# Patient Record
Sex: Female | Born: 1980 | Race: White | Hispanic: No | Smoking: Current every day smoker
Health system: Southern US, Community
[De-identification: ages and names within clinical notes are randomized; demographics above are authoritative.]

## PROBLEM LIST (undated history)

## (undated) DIAGNOSIS — K5090 Crohn's disease, unspecified, without complications: Secondary | ICD-10-CM

## (undated) DIAGNOSIS — I10 Essential (primary) hypertension: Secondary | ICD-10-CM

## (undated) HISTORY — PX: BREAST ENHANCEMENT SURGERY: SHX7

---

## 2016-05-02 ENCOUNTER — Emergency Department (HOSPITAL_COMMUNITY)
Admission: EM | Admit: 2016-05-02 | Discharge: 2016-05-02 | Discharge status: Against Medical Advice | Payer: Medicaid - Out of State | Source: Home / Self Care | Attending: Emergency Medicine | Admitting: Emergency Medicine

## 2016-05-02 ENCOUNTER — Emergency Department (HOSPITAL_COMMUNITY)
Admission: EM | Admit: 2016-05-02 | Discharge: 2016-05-03 | Disposition: A | Discharge status: Home / Self Care | Payer: Medicaid - Out of State | Attending: Emergency Medicine | Admitting: Emergency Medicine

## 2016-05-02 ENCOUNTER — Emergency Department (HOSPITAL_COMMUNITY): Payer: Medicaid - Out of State

## 2016-05-02 ENCOUNTER — Encounter (HOSPITAL_COMMUNITY): Payer: Self-pay | Admitting: Radiology

## 2016-05-02 ENCOUNTER — Encounter (HOSPITAL_COMMUNITY): Payer: Self-pay | Admitting: *Deleted

## 2016-05-02 DIAGNOSIS — M79669 Pain in unspecified lower leg: Secondary | ICD-10-CM | POA: Diagnosis not present

## 2016-05-02 DIAGNOSIS — Z79899 Other long term (current) drug therapy: Secondary | ICD-10-CM | POA: Insufficient documentation

## 2016-05-02 DIAGNOSIS — R509 Fever, unspecified: Secondary | ICD-10-CM | POA: Diagnosis not present

## 2016-05-02 DIAGNOSIS — D696 Thrombocytopenia, unspecified: Secondary | ICD-10-CM

## 2016-05-02 DIAGNOSIS — K859 Acute pancreatitis without necrosis or infection, unspecified: Secondary | ICD-10-CM | POA: Insufficient documentation

## 2016-05-02 DIAGNOSIS — Z3202 Encounter for pregnancy test, result negative: Secondary | ICD-10-CM

## 2016-05-02 DIAGNOSIS — R748 Abnormal levels of other serum enzymes: Secondary | ICD-10-CM | POA: Diagnosis not present

## 2016-05-02 DIAGNOSIS — D72819 Decreased white blood cell count, unspecified: Secondary | ICD-10-CM | POA: Insufficient documentation

## 2016-05-02 DIAGNOSIS — Z8719 Personal history of other diseases of the digestive system: Secondary | ICD-10-CM

## 2016-05-02 DIAGNOSIS — R112 Nausea with vomiting, unspecified: Secondary | ICD-10-CM

## 2016-05-02 DIAGNOSIS — L409 Psoriasis, unspecified: Secondary | ICD-10-CM | POA: Diagnosis not present

## 2016-05-02 DIAGNOSIS — I10 Essential (primary) hypertension: Secondary | ICD-10-CM | POA: Diagnosis not present

## 2016-05-02 DIAGNOSIS — R1084 Generalized abdominal pain: Secondary | ICD-10-CM | POA: Diagnosis not present

## 2016-05-02 DIAGNOSIS — F17210 Nicotine dependence, cigarettes, uncomplicated: Secondary | ICD-10-CM | POA: Insufficient documentation

## 2016-05-02 DIAGNOSIS — R109 Unspecified abdominal pain: Secondary | ICD-10-CM

## 2016-05-02 DIAGNOSIS — R945 Abnormal results of liver function studies: Secondary | ICD-10-CM | POA: Insufficient documentation

## 2016-05-02 DIAGNOSIS — R140 Abdominal distension (gaseous): Secondary | ICD-10-CM | POA: Insufficient documentation

## 2016-05-02 DIAGNOSIS — R1012 Left upper quadrant pain: Secondary | ICD-10-CM | POA: Diagnosis present

## 2016-05-02 HISTORY — DX: Crohn's disease, unspecified, without complications: K50.90

## 2016-05-02 LAB — DIFFERENTIAL
BASOS PCT: 1 %
Basophils Absolute: 0 10*3/uL (ref 0.0–0.1)
EOS PCT: 1 %
Eosinophils Absolute: 0 10*3/uL (ref 0.0–0.7)
Lymphocytes Relative: 22 %
Lymphs Abs: 0.7 10*3/uL (ref 0.7–4.0)
MONO ABS: 0.3 10*3/uL (ref 0.1–1.0)
MONOS PCT: 10 %
NEUTROS ABS: 2.2 10*3/uL (ref 1.7–7.7)
Neutrophils Relative %: 66 %

## 2016-05-02 LAB — URINALYSIS, ROUTINE W REFLEX MICROSCOPIC
GLUCOSE, UA: NEGATIVE mg/dL
Ketones, ur: 15 mg/dL — AB
Nitrite: NEGATIVE
PROTEIN: 100 mg/dL — AB
Specific Gravity, Urine: 1.025 (ref 1.005–1.030)
pH: 8.5 — ABNORMAL HIGH (ref 5.0–8.0)

## 2016-05-02 LAB — LIPASE, BLOOD: Lipase: 119 U/L — ABNORMAL HIGH (ref 11–51)

## 2016-05-02 LAB — COMPREHENSIVE METABOLIC PANEL
ALK PHOS: 110 U/L (ref 38–126)
ALT: 146 U/L — AB (ref 14–54)
ANION GAP: 17 — AB (ref 5–15)
AST: 585 U/L — ABNORMAL HIGH (ref 15–41)
Albumin: 4.3 g/dL (ref 3.5–5.0)
BUN: 6 mg/dL (ref 6–20)
CALCIUM: 9.7 mg/dL (ref 8.9–10.3)
CO2: 23 mmol/L (ref 22–32)
CREATININE: 0.81 mg/dL (ref 0.44–1.00)
Chloride: 99 mmol/L — ABNORMAL LOW (ref 101–111)
Glucose, Bld: 105 mg/dL — ABNORMAL HIGH (ref 65–99)
Potassium: 3.7 mmol/L (ref 3.5–5.1)
SODIUM: 139 mmol/L (ref 135–145)
TOTAL PROTEIN: 7.6 g/dL (ref 6.5–8.1)
Total Bilirubin: 2.3 mg/dL — ABNORMAL HIGH (ref 0.3–1.2)

## 2016-05-02 LAB — URINE MICROSCOPIC-ADD ON

## 2016-05-02 LAB — CBC
HCT: 41.2 % (ref 36.0–46.0)
HEMOGLOBIN: 14 g/dL (ref 12.0–15.0)
MCH: 33.7 pg (ref 26.0–34.0)
MCHC: 34 g/dL (ref 30.0–36.0)
MCV: 99.3 fL (ref 78.0–100.0)
PLATELETS: 41 10*3/uL — AB (ref 150–400)
RBC: 4.15 MIL/uL (ref 3.87–5.11)
RDW: 14.4 % (ref 11.5–15.5)
WBC: 3.3 10*3/uL — AB (ref 4.0–10.5)

## 2016-05-02 LAB — I-STAT CG4 LACTIC ACID, ED: Lactic Acid, Venous: 0.97 mmol/L (ref 0.5–2.0)

## 2016-05-02 LAB — PREGNANCY, URINE: Preg Test, Ur: NEGATIVE

## 2016-05-02 MED ORDER — SODIUM CHLORIDE 0.9 % IV BOLUS (SEPSIS)
1000.0000 mL | Freq: Once | INTRAVENOUS | Status: AC
  Administered 2016-05-02: 1000 mL via INTRAVENOUS

## 2016-05-02 MED ORDER — ONDANSETRON 4 MG PO TBDP: 4.0000 mg | ORAL_TABLET | Freq: Three times a day (TID) | ORAL | Status: AC | PRN

## 2016-05-02 MED ORDER — ONDANSETRON 4 MG PO TBDP
ORAL_TABLET | ORAL | Status: AC
  Filled 2016-05-02: qty 1

## 2016-05-02 MED ORDER — SODIUM CHLORIDE 0.9 % IV BOLUS (SEPSIS): 1000.0000 mL | Freq: Once | INTRAVENOUS | Status: DC

## 2016-05-02 MED ORDER — ONDANSETRON HCL 4 MG/2ML IJ SOLN: 4.0000 mg | Freq: Once | INTRAMUSCULAR | Status: DC

## 2016-05-02 MED ORDER — HYDROMORPHONE HCL 1 MG/ML IJ SOLN
1.0000 mg | Freq: Once | INTRAMUSCULAR | Status: AC
  Administered 2016-05-02: 1 mg via INTRAVENOUS
  Filled 2016-05-02: qty 1

## 2016-05-02 MED ORDER — IOPAMIDOL (ISOVUE-300) INJECTION 61%
INTRAVENOUS | Status: AC
  Administered 2016-05-02: 100 mL
  Filled 2016-05-02: qty 100

## 2016-05-02 MED ORDER — MORPHINE SULFATE (PF) 4 MG/ML IV SOLN: 4.0000 mg | Freq: Once | INTRAVENOUS | Status: DC

## 2016-05-02 MED ORDER — ONDANSETRON 4 MG PO TBDP
4.0000 mg | ORAL_TABLET | Freq: Once | ORAL | Status: AC | PRN
  Administered 2016-05-02: 4 mg via ORAL

## 2016-05-02 MED ORDER — MORPHINE SULFATE (PF) 4 MG/ML IV SOLN
4.0000 mg | Freq: Once | INTRAVENOUS | Status: AC
Start: 2016-05-02 — End: 2016-05-02
  Administered 2016-05-02: 4 mg via INTRAVENOUS
  Filled 2016-05-02: qty 1

## 2016-05-02 NOTE — ED Provider Notes (Signed)
CSN: [id number]     Arrival date & time 05/02/16  0902 History   First MD Initiated Contact with Patient 05/02/16 [id number]     Chief Complaint  Patient presents with  . Emesis     (Consider location/radiation/quality/duration/timing/severity/associated sxs/prior Treatment) HPI  Pt with hx of chrons disease presents with acute onset yesterday afternoon of vomiting.  She states she has had more than 20 episodes of emesis.  No diarrhea.  She has had subjective fever and chills as well.  Some abdomina pain in left upper abdomen.  No dysuria.  No change in stools.  Has not had to ever take steroids for a chrons flare.  She has not been travelling recently, no sick contacts.  No treatment prior to arrival.  Emesis is nonbloody and nonbilious.  There are no other associated systemic symptoms, there are no other alleviating or modifying factors.   Past Medical History  Diagnosis Date  . Crohn disease [facility])    Past Surgical History  Procedure Laterality Date  . Cesarean section    . Breast enhancement surgery     No family history on file. Social History  Substance Use Topics  . Smoking status: Current Every Day Smoker -- 0.50 packs/day    Types: Cigarettes  . Smokeless tobacco: None  . Alcohol Use: Yes     Comment: occ   OB History    No data available     Review of Systems  ROS reviewed and all otherwise negative except for mentioned in HPI    Allergies  Review of patient's allergies indicates no known allergies.  Home Medications   Prior to Admission medications   Medication Sig Start Date End Date Taking? Authorizing Provider  albuterol (PROVENTIL HFA;VENTOLIN HFA) 108 (90 Base) MCG/ACT inhaler Inhale 2 puffs into the lungs every 6 (six) hours as needed for wheezing or shortness of breath.   Yes Historical Provider, MD  ondansetron (ZOFRAN ODT) 4 MG disintegrating tablet Take 1 tablet (4 mg total) by mouth every 8 (eight) hours as needed for nausea or vomiting. 05/02/16   Jerelyn Scott, MD   BP 146/103 mmHg  Pulse 90  Temp(Src) 98.3 F (36.8 C) (Oral)  Resp 19  Ht  (1.753 m)  Wt 86.183 kg  BMI 28.05 kg/m2  SpO2 94%  LMP 03/06/2016  Vitals reviewed Physical Exam  Physical Examination: General appearance - alert, well appearing, and in no distress Mental status - alert, oriented to person, place, and time Eyes - no scleral icterus, no conjunctival injection Mouth - mucous membranes moist, pharynx normal without lesions Chest - clear to auscultation, no wheezes, rales or rhonchi, symmetric air entry Heart - normal rate, regular rhythm, normal S1, S2, no murmurs, rubs, clicks or gallops Abdomen - soft, ttp in left upper abdomen, no gaurding, nabs, nondistended, no masses or organomegaly Neurological - alert, oriented, normal speech, diffuse tremor of 4 extremities Extremities - peripheral pulses normal, no pedal edema, no clubbing or cyanosis Skin - normal coloration and turgor, no rashes  ED Course  Procedures (including critical care time) Labs Review Labs Reviewed  LIPASE, BLOOD - Abnormal; Notable for the following:    Lipase 119 (*)    All other components within normal limits  COMPREHENSIVE METABOLIC PANEL - Abnormal; Notable for the following:    Chloride 99 (*)    Glucose, Bld 105 (*)    AST 585 (*)    ALT 146 (*)    Total Bilirubin 2.3 (*)  Anion gap 17 (*)    All other components within normal limits  CBC - Abnormal; Notable for the following:    WBC 3.3 (*)    Platelets 41 (*)    All other components within normal limits  URINALYSIS, ROUTINE W REFLEX MICROSCOPIC (NOT AT [facility]) - Abnormal; Notable for the following:    Color, Urine ORANGE (*)    APPearance CLOUDY (*)    pH 8.5 (*)    Hgb urine dipstick LARGE (*)    Bilirubin Urine SMALL (*)    Ketones, ur 15 (*)    Protein, ur 100 (*)    Leukocytes, UA SMALL (*)    All other components within normal limits  URINE MICROSCOPIC-ADD ON - Abnormal; Notable for the following:     Squamous Epithelial / LPF 6-30 (*)    Bacteria, UA FEW (*)    All other components within normal limits  PREGNANCY, URINE  DIFFERENTIAL    Imaging Review No results found. I have personally reviewed and evaluated these images and lab results as part of my medical decision-making.   EKG Interpretation None      MDM   Final diagnoses:  Nausea and vomiting, vomiting of unspecified type  Elevated liver enzymes  Acute pancreatitis, unspecified pancreatitis type  Thrombocytopenia (HCC)  Leukopenia    Pt presenting with c/o abdominal pain, vomiting.  She has hx of Crohns but has not had many acute flares.  ttp in left upper abdomen.  Pt treated with IV fluids and zofran.  Labs reveal leukopenia, thromboyctopenia, elevated LFTS, elevated lipase.  Pt admits to daily drinking and no alcohol in the past 2 days.  Abdominal CT scan ordered.  Pt states she needs to leave to go to [place].  I have talked with patient at length about all her results thus far- concern for autoimmune, infection, liver failure, alcohol withdrawal, multiple other possibilities on differential diagnosis.  Pt states she is going to [place] for a family funeral and has to leave.  She verbalizes understanding and states she will seek treatment in [place] if needed.  Her home [place] in [place].  i have given a rx for zofran and warned patient of worsening condition or even death.  She has the capacity to make the decision to leave AMA after being strongly encouraged to stay.    9:49 AM went to see patient, she is in the bathroom.   Jerelyn ScottMartha Linker, MD 05/02/16 [phone]

## 2016-05-02 NOTE — ED Notes (Signed)
Patient states "I am supposed to leave for [place] in an hour".  Patient did not want to stay for IV or CT.   Patient was advised that we recommend she stay.   It was explained to patient that she would be leaving AMA.  Patient understood and agreed to stay.

## 2016-05-02 NOTE — ED Provider Notes (Signed)
CSN: [id number]     Arrival date & time 05/02/16  1237 History   First MD Initiated Contact with Patient 05/02/16 1553     Chief Complaint  Patient presents with  . Abdominal Pain     (Consider location/radiation/quality/duration/timing/severity/associated sxs/prior Treatment) HPI   Patient is a 35 year old female with history of psoriasis, Crohn's disease, hypertension all untreated who presents the emergency department with 1 day of vomiting and abdominal pain. She states the pain is in her left upper quadrant and radiates to her left shoulder and her right upper quadrant, sharp/pressure, constant, 10/10, associated nausea and vomiting. Pain worse with deep breathing or bending forward, nothing relieves the pain. She came to the ER this morning and left before a CT scan was performed. She was trying to make a trip from [place] to [place] for a funeral. She states she drinks roughly 2 glasses of wine per day last week was 3 days ago. She also states associated subjective fever and chills. She states she had a partial black stool yesterday, with a normal bowel movement today. She denies dysuria, hematuria, vaginal discharge, headache, dizziness, syncope, chest pain shortness breath. Patient states she has calf pain and tenderness for greater than 2 weeks. She had an ultrasound last week of her calves which was negative for DVT. Patient states before her vomiting started she had [employer] chicken. She states she has not vomited since this morning.   Past Medical History  Diagnosis Date  . Crohn disease [facility])    Past Surgical History  Procedure Laterality Date  . Cesarean section    . Breast enhancement surgery     No family history on file. Social History  Substance Use Topics  . Smoking status: Current Every Day Smoker -- 0.50 packs/day    Types: Cigarettes  . Smokeless tobacco: None  . Alcohol Use: Yes     Comment: occ   OB History    No data available     Review of Systems   Constitutional: Positive for fever and chills.  HENT: Negative for trouble swallowing.   Eyes: Negative for visual disturbance.  Respiratory: Negative for cough, chest tightness and shortness of breath.   Cardiovascular: Negative for chest pain and leg swelling.  Gastrointestinal: Positive for nausea, vomiting, abdominal pain and abdominal distention. Negative for diarrhea and constipation.  Genitourinary: Negative for dysuria, hematuria, vaginal bleeding and vaginal discharge.  Musculoskeletal: Negative for myalgias, arthralgias and neck pain.  Skin: Negative for color change and wound.  Neurological: Negative for dizziness, syncope and headaches.  Psychiatric/Behavioral: Negative for confusion and agitation.      Allergies  Review of patient's allergies indicates no known allergies.  Home Medications   Prior to Admission medications   Medication Sig Start Date End Date Taking? Authorizing Provider  albuterol (PROVENTIL HFA;VENTOLIN HFA) 108 (90 Base) MCG/ACT inhaler Inhale 2 puffs into the lungs every 6 (six) hours as needed for wheezing or shortness of breath.   Yes Historical Provider, MD  ondansetron (ZOFRAN ODT) 4 MG disintegrating tablet Take 1 tablet (4 mg total) by mouth every 8 (eight) hours as needed for nausea or vomiting. 05/02/16   Jerelyn Scott, MD   BP 149/114 mmHg  Pulse 105  Temp(Src) 98.8 F (37.1 C) (Oral)  Resp 18  SpO2 98%  LMP 03/06/2016 Physical Exam  Constitutional: She appears well-developed and well-nourished. No distress.  HENT:  Head: Normocephalic and atraumatic.  Eyes: Conjunctivae are normal.  Neck: Normal range of motion. No tracheal  deviation present.  Cardiovascular: Normal rate and normal heart sounds.   Pulmonary/Chest: Effort normal and breath sounds normal.  Abdominal: Normal appearance and bowel sounds are normal.  Abdomen appears mildly distended, generalized TTP with exquisite tenderness to left upper quadrant, negative Murphy sign.  Positive for guarding, positive for CVA tenderness greater on the left than the right  Musculoskeletal: Normal range of motion. She exhibits no edema or tenderness.  Neurological: She is alert. Coordination normal.  Skin: Skin is warm and dry.  Scattered Areas of psoriasis on bilateral upper extremities and back  Psychiatric: She has a normal mood and affect. Her behavior is normal.    ED Course  Procedures (including critical care time)  6:40 PM: Discussed results of CT scan with patient. States her pain is better 6 out of 10. Her nausea has also improved. Discussed discharge. Patient is comfortable with being discharged at this time. Patient was able to drink Gatorade while in the ED and experienced no vomiting.  Labs Review Labs Reviewed  I-STAT CG4 LACTIC ACID, ED    Imaging Review Ct Abdomen Pelvis W Contrast  05/02/2016  CLINICAL DATA:  Upper abdomen pain with vomiting and black stool. EXAM: CT ABDOMEN AND PELVIS WITH CONTRAST TECHNIQUE: Multidetector CT imaging of the abdomen and pelvis was performed using the standard protocol following bolus administration of intravenous contrast. CONTRAST:  100mL ISOVUE-300 IOPAMIDOL (ISOVUE-300) INJECTION 61% COMPARISON:  None. FINDINGS: Lower chest: There is minimal scarring of the posterior right lung base. There is no focal pneumonia or pleural effusion. The heart size normal. Hepatobiliary: There is severe fatty infiltration the liver. No focal mass is identified in the liver. The gallbladder is normal. Pancreas: No mass, inflammatory changes, or other significant abnormality. Spleen: Within normal limits in size and appearance. Adrenals/Urinary Tract: The adrenal glands and kidneys are normal. The bladder is normal. No masses identified. No evidence of hydronephrosis. Stomach/Bowel: No evidence of obstruction, inflammatory process, or abnormal fluid collections. There is no diverticulitis. The appendix is not seen but no inflammation is noted  around cecum. There is small ventral herniation of colon in the left anterior pelvis. Vascular/Lymphatic: No pathologically enlarged lymph nodes. No evidence of abdominal aortic aneurysm. Reproductive: No mass or other significant abnormality. Pelvic phleboliths are noted. Other: None. Musculoskeletal:  No suspicious bone lesions identified. IMPRESSION: No acute abnormality identified in the abdomen and pelvis. There is no bowel obstruction or diverticulitis. Electronically Signed   By: Sherian ReinWei-Chen  Lin M.D.   On: 05/02/2016 17:59   I have personally reviewed and evaluated these images and lab results as part of my medical decision-making.   EKG Interpretation None      MDM   Final diagnoses:  Abdominal pain, acute   Patient is a 35 year old female with history of psoriasis, Crohn's disease, hypertension all untreated who presents the emergency department with 1 day of vomiting and abdominal pain. CT scan negative for any acute abnormality, bowel obstruction or diverticulitis. No hydronephrosis suggesting kidney stone. Lactic acid was negative so less concerning for bowel obstruction. This is likely a gastritis. Patient without vomiting for roughly 10 hours.  CT scan did reveal severe fatty infiltration of the liver with normal gallbladder. Likely explaining elevated AST, ALT, lipase and bilirubin. I discussed importance of strict follow-up with the diagnoses of fatty liver disease and abstinence from alcohol. Patient states she will acquire a PCP when she returns to [place] for follow-up. Pt had tremors before administering pain meds. Tremors decreased and pt stated she  has them at baseline. I discussed alcohol withdrawal in depth including encephalopathy as a complication of withdrawal and she assured me that she has not had a drink in 3 days and she only drinks 1-2 glasses of wine per day. She stated she had been a heavy drinker in the past but has cut down greatly within the last 3 months. With her  history I am less concerned about DTs.   Pt has a history of high blood pressure although she is not being treated for it. BP was elevated upon arrival to the ED. At time of discharge it had decreased. This was likely 2/2 pain. I instructed the patient to follow up with a primary care provider regarding her blood pressure as well.  RBC and urine likely contaminant from vaginal menstrual spotting. She has no history of kidney stones.   I discussed strict return precautions with the patient. Instructed her to eat a bland diet and avoid fatty foods. She states she'll be [place] for a few days before returning to [place]. I instructed her to get a PCP and follow-up regarding her liver enzymes. I discussed all of the results with the patient and family members they have expressed their understanding to the verbal discharge instructions.         Jerre Simon, PA 05/02/16 2019  Nelva Nay, MD 05/04/16 [phone]

## 2016-05-02 NOTE — ED Notes (Signed)
Called CT to request scans be put on a cd, per the request of Mattie MarlinJessica Focht, [place].

## 2016-05-02 NOTE — ED Notes (Signed)
Pt c/o LLQ abd pain onset yesterday, pt c/o N/v/d, pt reports black stools, pt c/o excessive non stop vomiting since yesterday, hx of Crohn's Dz, pt A&O x4

## 2016-05-02 NOTE — ED Notes (Signed)
Called CT to inquire as to status of CD. CT staff will check to see if it's ready & bring it over when it is.

## 2016-05-02 NOTE — Discharge Instructions (Signed)
Find a Primary Care Provider [facility] [place] [facility] to follow up regarding your elevated liver enzymes, elevated lipase, and recent visit to our emergency department.   Return to the emergency department if you experience worsening abdominal pain, uncontrolled vomiting, blood in your vomit, fever, chills, chest pain, shortness of breath.  Abdominal Pain, Adult Many things can cause abdominal pain. Usually, abdominal pain is not caused by a disease and will improve without treatment. It can often be observed and treated at home. Your health care provider will do a physical exam and possibly order blood tests and X-rays to help determine the seriousness of your pain. However, in many cases, more time must pass before a clear cause of the pain can be found. Before that point, your health care provider may not know if you need more testing or further treatment. HOME CARE INSTRUCTIONS Monitor your abdominal pain for any changes. The following actions may help to alleviate any discomfort you are experiencing:  Only take over-the-counter or prescription medicines as directed by your health care provider.  Do not take laxatives unless directed to do so by your health care provider.  Try a clear liquid diet (broth, tea, or water) as directed by your health care provider. Slowly move to a bland diet as tolerated. SEEK MEDICAL CARE IF:  You have unexplained abdominal pain.  You have abdominal pain associated with nausea or diarrhea.  You have pain when you urinate or have a bowel movement.  You experience abdominal pain that wakes you in the night.  You have abdominal pain that is worsened or improved by eating food.  You have abdominal pain that is worsened with eating fatty foods.  You have a fever. SEEK IMMEDIATE MEDICAL CARE IF:  Your pain does not go away within 2 hours.  You keep throwing up (vomiting).  Your pain is felt only in portions of the abdomen, such as the right side or the  left lower portion of the abdomen.  You pass bloody or black tarry stools. MAKE SURE YOU:  Understand these instructions.  Will watch your condition.  Will get help right away if you are not doing well or get worse.   This information is not intended to replace advice given to you by your health care provider. Make sure you discuss any questions you have with your health care provider.   Document Released: 09/13/2005 Document Revised: 08/25/2015 Document Reviewed: 08/13/2013 Elsevier Interactive Patient Education [employer]2016 [employer].

## 2016-05-02 NOTE — ED Notes (Signed)
CT delivered CD to patient at this time prior to discharge.

## 2016-05-02 NOTE — ED Notes (Signed)
Called CT again to ask about CD completion.  "Still working on it"

## 2016-05-02 NOTE — ED Notes (Signed)
Pt ambulates independently and with steady gait at time of discharge. Discharge instructions and follow up information reviewed with patient. No other questions or concerns voiced at this time.  

## 2016-05-02 NOTE — ED Notes (Signed)
CD provided to patient by CT prior to discharge. Patient declined wheelchair at time of discharge. No acute distress noted.

## 2016-05-02 NOTE — ED Notes (Addendum)
She c/o 1 day history of n/v, abd pain. She was seen here today and was told she needed a ct scan but she had to leave and is back now requesting ct scan. She is alert and breathing easily

## 2016-05-02 NOTE — ED Notes (Signed)
IV start x 2 unsuccessful attempts.  Will have another RN try.

## 2017-02-06 IMAGING — CT CT ABD-PELV W/ CM
2 of 4 series · 16 of 46 positions shown, 18 images · IV contrast (iopamidol)
Comparison: None.

CLINICAL DATA: Upper abdomen pain with vomiting and black stool.

EXAM:
CT ABDOMEN AND PELVIS WITH CONTRAST
TECHNIQUE: Multidetector CT imaging of the abdomen and pelvis was performed
using the standard protocol following bolus administration of
intravenous contrast.
CONTRAST:  100mL 1AAKJK-MUU IOPAMIDOL (1AAKJK-MUU) INJECTION 61%

[Series 2: abd/ pelvis 5.0 i30f 1 · axial · 0.76mm/px · z∈[+861,+1246]mm · 13 of 85 slices shown, 15 images]
[im 4/85  soft-tissue]
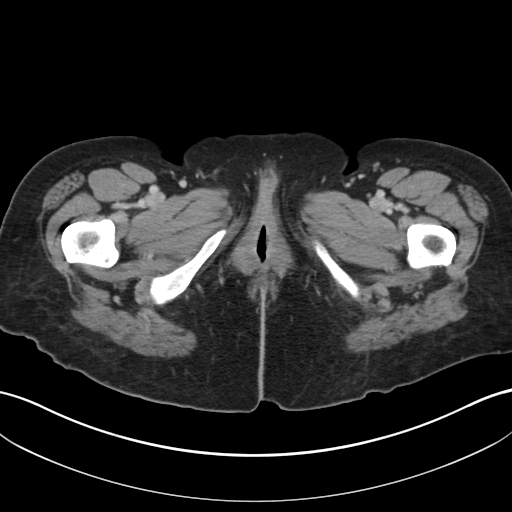
[im 4/85  bone]
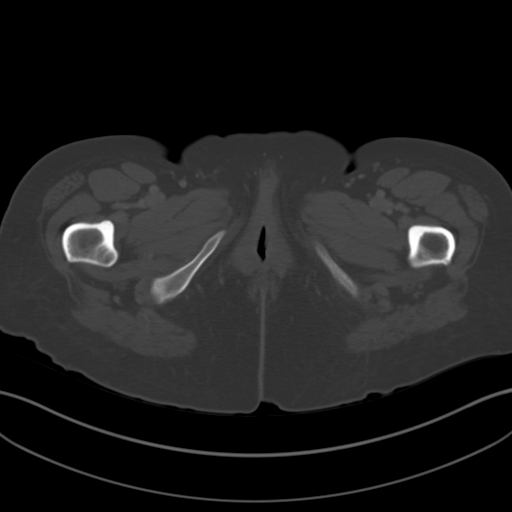
[im 11/85  soft-tissue]
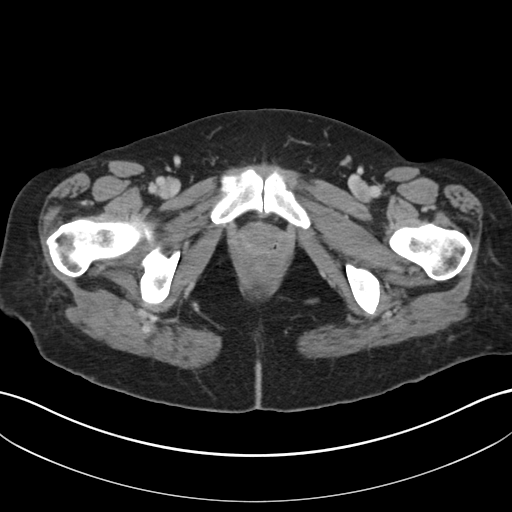
[im 17/85  soft-tissue]
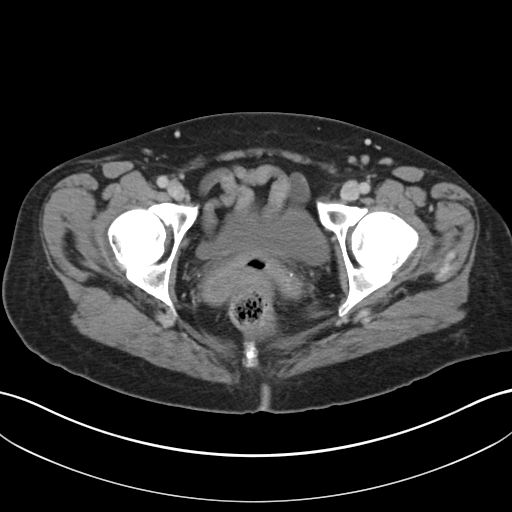
[im 24/85  soft-tissue]
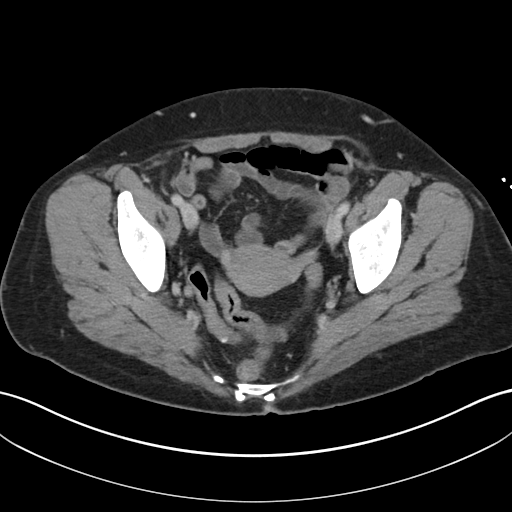
[im 31/85  soft-tissue]
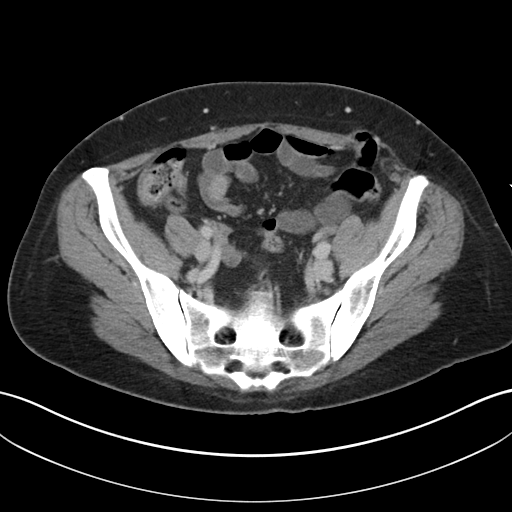
[im 37/85  soft-tissue]
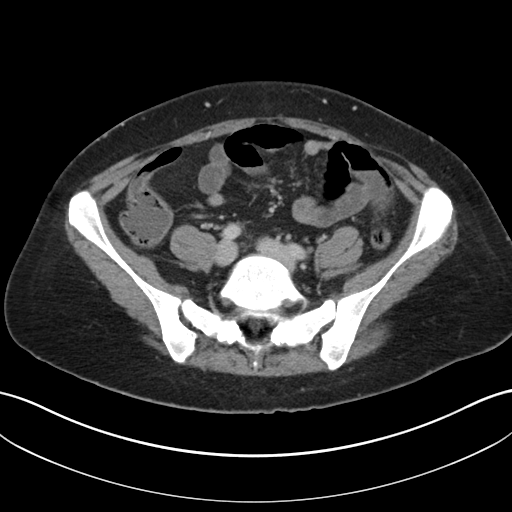
[im 44/85  soft-tissue]
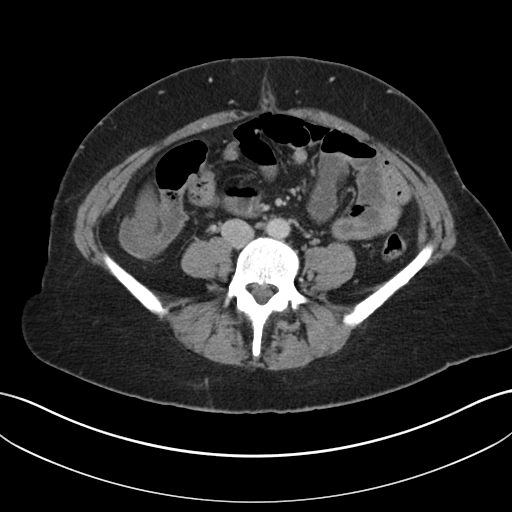
[im 48/85  soft-tissue]
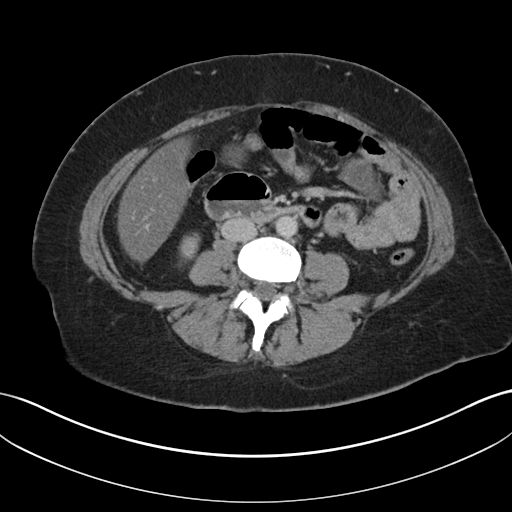
[im 54/85  soft-tissue]
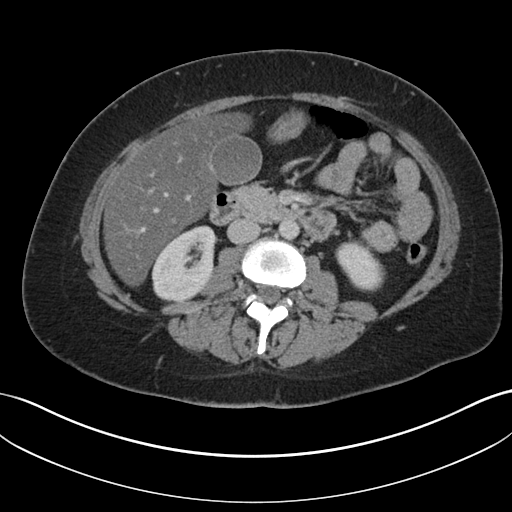
[im 54/85  bone]
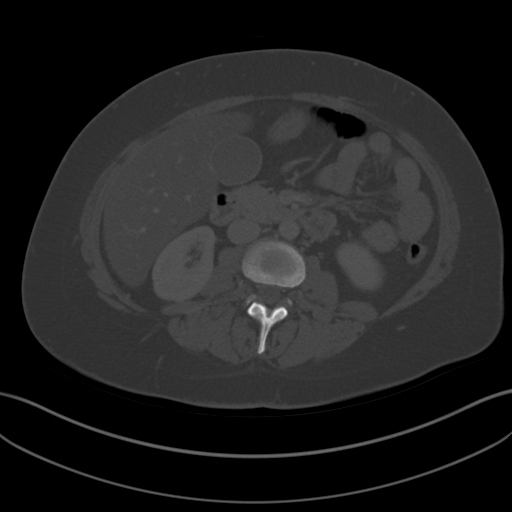
[im 61/85  soft-tissue]
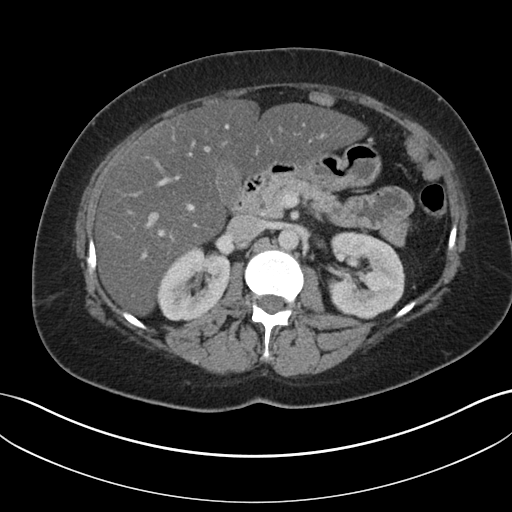
[im 68/85  soft-tissue]
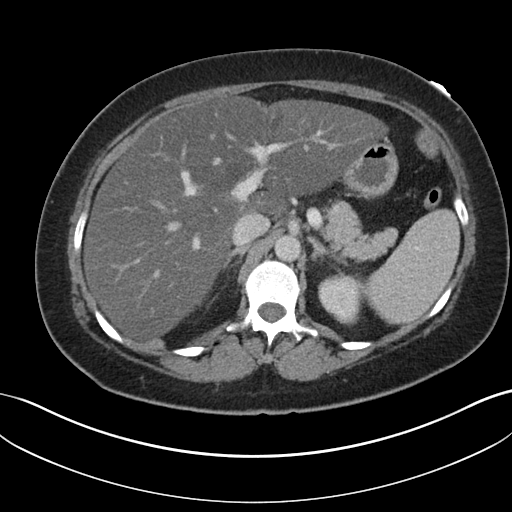
[im 74/85  soft-tissue]
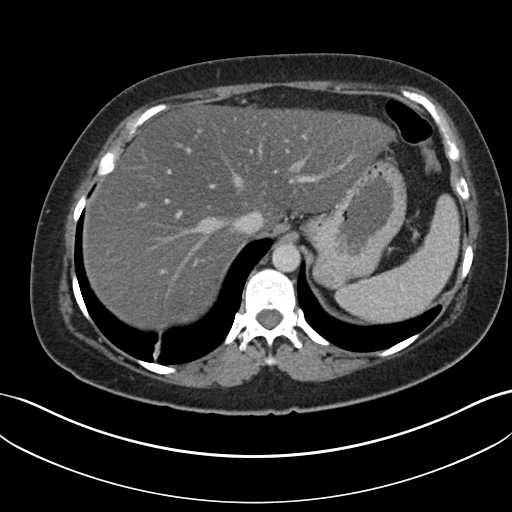
[im 81/85  soft-tissue]
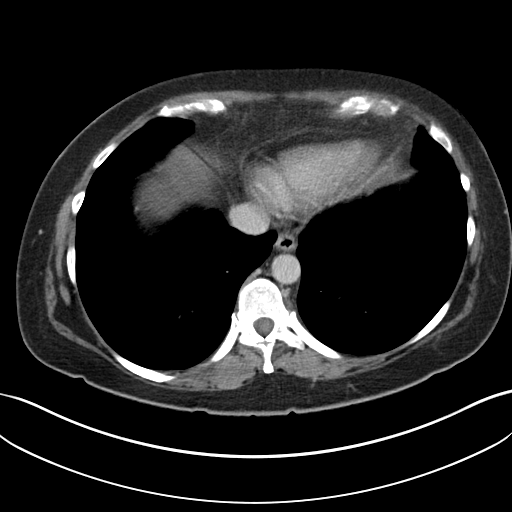

[Series 4: coronal soft tissue · coronal · 0.78mm/px · 3 of 92 slices shown]
[im 31/92  soft-tissue]
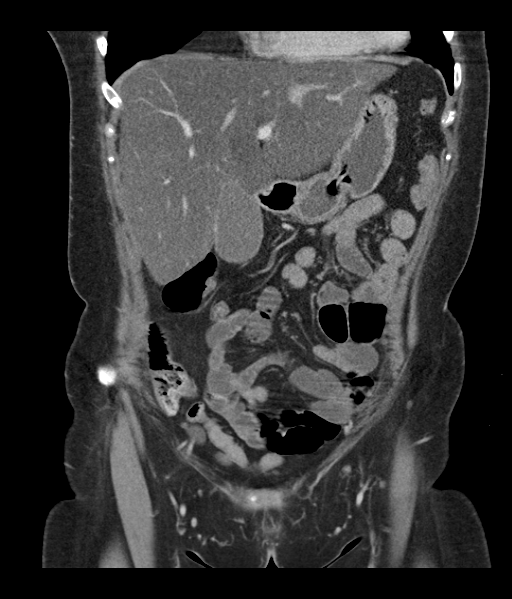
[im 41/92  soft-tissue]
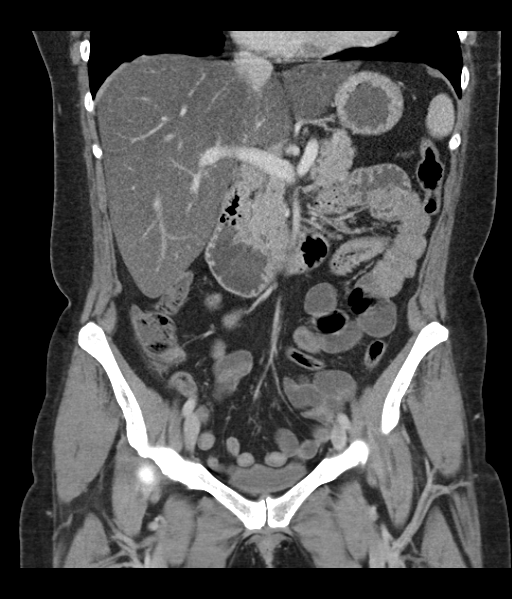
[im 51/92  soft-tissue]
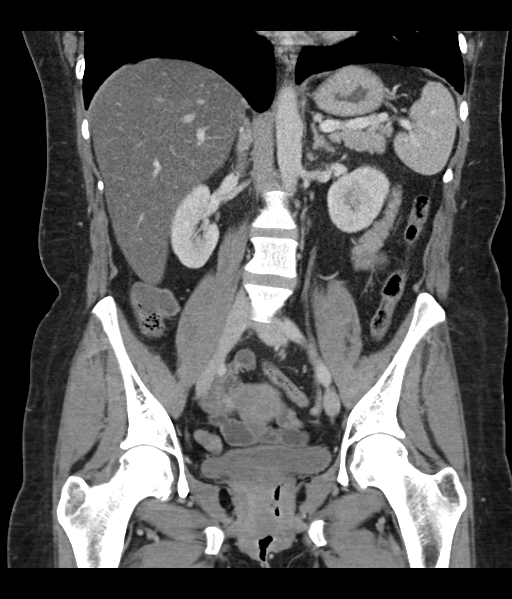

[16 of 46 positions shown; findings below may reference images not displayed]

FINDINGS: Lower chest: There is minimal scarring of the posterior right lung
base. There is no focal pneumonia or pleural effusion. The heart
size normal.

Hepatobiliary: There is severe fatty infiltration the liver. No
focal mass is identified in the liver. The gallbladder is normal.

Pancreas: No mass, inflammatory changes, or other significant
abnormality.

Spleen: Within normal limits in size and appearance.

Adrenals/Urinary Tract: The adrenal glands and kidneys are normal.
The bladder is normal. No masses identified. No evidence of
hydronephrosis.

Stomach/Bowel: No evidence of obstruction, inflammatory process, or
abnormal fluid collections. There is no diverticulitis. The appendix
is not seen but no inflammation is noted around cecum. There is
small ventral herniation of colon in the left anterior pelvis.

Vascular/Lymphatic: No pathologically enlarged lymph nodes. No
evidence of abdominal aortic aneurysm.

Reproductive: No mass or other significant abnormality. Pelvic
phleboliths are noted.

Other: None.

Musculoskeletal:  No suspicious bone lesions identified.
IMPRESSION: No acute abnormality identified in the abdomen and pelvis.

There is no bowel obstruction or diverticulitis.

## 2019-08-24 ENCOUNTER — Emergency Department: Payer: Self-pay

## 2019-08-24 ENCOUNTER — Inpatient Hospital Stay
Admission: EM | Admit: 2019-08-24 | Discharge: 2019-08-27 | DRG: 439 | Discharge status: Against Medical Advice | Payer: Self-pay | Attending: Internal Medicine | Admitting: Internal Medicine

## 2019-08-24 DIAGNOSIS — Z20828 Contact with and (suspected) exposure to other viral communicable diseases: Secondary | ICD-10-CM | POA: Diagnosis present

## 2019-08-24 DIAGNOSIS — I10 Essential (primary) hypertension: Secondary | ICD-10-CM | POA: Diagnosis present

## 2019-08-24 DIAGNOSIS — R569 Unspecified convulsions: Secondary | ICD-10-CM | POA: Diagnosis present

## 2019-08-24 DIAGNOSIS — R112 Nausea with vomiting, unspecified: Secondary | ICD-10-CM | POA: Diagnosis present

## 2019-08-24 DIAGNOSIS — F10930 Alcohol use, unspecified with withdrawal, uncomplicated: Secondary | ICD-10-CM | POA: Diagnosis present

## 2019-08-24 DIAGNOSIS — K8521 Alcohol induced acute pancreatitis with uninfected necrosis: Principal | ICD-10-CM | POA: Diagnosis present

## 2019-08-24 DIAGNOSIS — R740 Nonspecific elevation of levels of transaminase and lactic acid dehydrogenase [LDH]: Secondary | ICD-10-CM

## 2019-08-24 DIAGNOSIS — F419 Anxiety disorder, unspecified: Secondary | ICD-10-CM | POA: Diagnosis present

## 2019-08-24 DIAGNOSIS — K852 Alcohol induced acute pancreatitis without necrosis or infection: Secondary | ICD-10-CM

## 2019-08-24 DIAGNOSIS — E872 Acidosis: Secondary | ICD-10-CM | POA: Diagnosis present

## 2019-08-24 DIAGNOSIS — K859 Acute pancreatitis without necrosis or infection, unspecified: Secondary | ICD-10-CM

## 2019-08-24 DIAGNOSIS — D696 Thrombocytopenia, unspecified: Secondary | ICD-10-CM | POA: Diagnosis present

## 2019-08-24 DIAGNOSIS — R17 Unspecified jaundice: Secondary | ICD-10-CM

## 2019-08-24 DIAGNOSIS — R8290 Unspecified abnormal findings in urine: Secondary | ICD-10-CM | POA: Diagnosis present

## 2019-08-24 DIAGNOSIS — E8729 Other acidosis: Secondary | ICD-10-CM | POA: Diagnosis present

## 2019-08-24 DIAGNOSIS — Z79899 Other long term (current) drug therapy: Secondary | ICD-10-CM

## 2019-08-24 DIAGNOSIS — E876 Hypokalemia: Secondary | ICD-10-CM | POA: Diagnosis not present

## 2019-08-24 DIAGNOSIS — R197 Diarrhea, unspecified: Secondary | ICD-10-CM | POA: Diagnosis present

## 2019-08-24 DIAGNOSIS — F1210 Cannabis abuse, uncomplicated: Secondary | ICD-10-CM | POA: Diagnosis present

## 2019-08-24 DIAGNOSIS — R7401 Elevation of levels of liver transaminase levels: Secondary | ICD-10-CM

## 2019-08-24 DIAGNOSIS — F1010 Alcohol abuse, uncomplicated: Secondary | ICD-10-CM | POA: Diagnosis present

## 2019-08-24 DIAGNOSIS — K7010 Alcoholic hepatitis without ascites: Secondary | ICD-10-CM | POA: Diagnosis present

## 2019-08-24 DIAGNOSIS — F10230 Alcohol dependence with withdrawal, uncomplicated: Secondary | ICD-10-CM | POA: Diagnosis present

## 2019-08-24 HISTORY — DX: Essential (primary) hypertension: I10

## 2019-08-24 LAB — ECG 12-LEAD
Atrial Rate: 139 {beats}/min
P Axis: 67 degrees
P-R Interval: 148 ms
Q-T Interval: 274 ms
QRS Duration: 82 ms
QTC Calculation (Bezet): 416 ms
R Axis: 74 degrees
T Axis: 60 degrees
Ventricular Rate: 139 {beats}/min

## 2019-08-24 LAB — URINALYSIS REFLEX TO MICROSCOPIC EXAM - REFLEX TO CULTURE
Glucose, UA: NEGATIVE
Ketones UA: 80
Nitrite, UA: NEGATIVE
Protein, UR: >=500 — AB
Specific Gravity UA: 1.029 (ref 1.001–1.035)
Urine pH: 6 (ref 5.0–8.0)
Urobilinogen, UA: 2 mg/dL (ref 0.2–2.0)

## 2019-08-24 LAB — CBC AND DIFFERENTIAL
Absolute NRBC: 0 10*3/uL (ref 0.00–0.00)
Basophils Absolute Automated: 0.04 10*3/uL (ref 0.00–0.08)
Basophils Automated: 0.7 %
Eosinophils Absolute Automated: 0.01 10*3/uL (ref 0.00–0.44)
Eosinophils Automated: 0.2 %
Hematocrit: 42 % (ref 34.7–43.7)
Hgb: 14.2 g/dL (ref 11.4–14.8)
Immature Granulocytes Absolute: 0.03 10*3/uL (ref 0.00–0.07)
Immature Granulocytes: 0.5 %
Lymphocytes Absolute Automated: 0.99 10*3/uL (ref 0.42–3.22)
Lymphocytes Automated: 17.8 %
MCH: 32.7 pg (ref 25.1–33.5)
MCHC: 33.8 g/dL (ref 31.5–35.8)
MCV: 96.8 fL — ABNORMAL HIGH (ref 78.0–96.0)
MPV: 9.9 fL (ref 8.9–12.5)
Monocytes Absolute Automated: 0.48 10*3/uL (ref 0.21–0.85)
Monocytes: 8.6 %
Neutrophils Absolute: 4.01 10*3/uL (ref 1.10–6.33)
Neutrophils: 72.2 %
Nucleated RBC: 0 /100 WBC (ref 0.0–0.0)
Platelets: 127 10*3/uL — ABNORMAL LOW (ref 142–346)
RBC: 4.34 10*6/uL (ref 3.90–5.10)
RDW: 14 % (ref 11–15)
WBC: 5.56 10*3/uL (ref 3.10–9.50)

## 2019-08-24 LAB — COMPREHENSIVE METABOLIC PANEL
ALT: 135 U/L — ABNORMAL HIGH (ref 0–55)
AST (SGOT): 232 U/L — ABNORMAL HIGH (ref 5–34)
Albumin/Globulin Ratio: 1.2 (ref 0.9–2.2)
Albumin: 4.3 g/dL (ref 3.5–5.0)
Alkaline Phosphatase: 210 U/L — ABNORMAL HIGH (ref 37–106)
Anion Gap: 22 — ABNORMAL HIGH (ref 5.0–15.0)
BUN: 11 mg/dL (ref 7.0–19.0)
Bilirubin, Total: 2.4 mg/dL — ABNORMAL HIGH (ref 0.2–1.2)
CO2: 20 mEq/L — ABNORMAL LOW (ref 22–29)
Calcium: 9.5 mg/dL (ref 8.5–10.5)
Chloride: 96 mEq/L — ABNORMAL LOW (ref 100–111)
Creatinine: 0.9 mg/dL (ref 0.6–1.0)
Globulin: 3.7 g/dL — ABNORMAL HIGH (ref 2.0–3.6)
Glucose: 172 mg/dL — ABNORMAL HIGH (ref 70–100)
Potassium: 3.6 mEq/L (ref 3.5–5.1)
Protein, Total: 8 g/dL (ref 6.0–8.3)
Sodium: 138 mEq/L (ref 136–145)

## 2019-08-24 LAB — URINE BHCG POC: Urine bHCG POC: NEGATIVE

## 2019-08-24 LAB — BILIRUBIN, DIRECT: Bilirubin Direct: 1.2 mg/dL — ABNORMAL HIGH (ref 0.0–0.5)

## 2019-08-24 LAB — ETHANOL: Alcohol: NOT DETECTED mg/dL

## 2019-08-24 LAB — TROPONIN I: Troponin I: <0.01 ng/mL (ref 0.00–0.05)

## 2019-08-24 LAB — HEMOLYSIS INDEX: Hemolysis Index: 15 (ref 0–18)

## 2019-08-24 LAB — GFR: EGFR: >60

## 2019-08-24 LAB — LIPASE: Lipase: 300 U/L — ABNORMAL HIGH (ref 8–78)

## 2019-08-24 MED ORDER — IOHEXOL 350 MG/ML IV SOLN
100.00 mL | Freq: Once | INTRAVENOUS | Status: AC | PRN
Start: 2019-08-24 — End: 2019-08-24
  Administered 2019-08-24: 10:00:00 100 mL via INTRAVENOUS

## 2019-08-24 MED ORDER — TAB-A-VITE/BETA CAROTENE PO TABS
1.0000 | ORAL_TABLET | Freq: Every day | ORAL | Status: DC
Start: 2019-08-27 — End: 2019-08-25

## 2019-08-24 MED ORDER — SODIUM CHLORIDE 0.9 % IV BOLUS
2000.00 mL | Freq: Once | INTRAVENOUS | Status: AC
Start: 2019-08-24 — End: 2019-08-24
  Administered 2019-08-24: 09:00:00 2000 mL via INTRAVENOUS

## 2019-08-24 MED ORDER — FOLIC ACID 1 MG PO TABS
1.0000 mg | ORAL_TABLET | Freq: Every day | ORAL | Status: DC
Start: 2019-08-27 — End: 2019-08-25

## 2019-08-24 MED ORDER — THIAMINE (VITAMIN B1) 100 MG PO TABS (WRAP)
100.0000 mg | ORAL_TABLET | Freq: Every day | ORAL | Status: DC
Start: 2019-08-27 — End: 2019-08-25

## 2019-08-24 MED ORDER — MORPHINE SULFATE 4 MG/ML IJ/IV SOLN (WRAP)
4.0000 mg | Freq: Once | Status: AC | PRN
Start: 2019-08-24 — End: 2019-08-24
  Administered 2019-08-24: 4 mg via INTRAVENOUS
  Filled 2019-08-24: qty 1

## 2019-08-24 MED ORDER — MORPHINE SULFATE 4 MG/ML IJ/IV SOLN (WRAP)
4.0000 mg | Freq: Once | Status: AC
Start: 2019-08-24 — End: 2019-08-24
  Administered 2019-08-24: 12:00:00 4 mg via INTRAVENOUS
  Filled 2019-08-24: qty 1

## 2019-08-24 MED ORDER — OXYCODONE HCL 5 MG PO TABS
5.0000 mg | ORAL_TABLET | ORAL | Status: DC | PRN
Start: 2019-08-24 — End: 2019-08-24
  Administered 2019-08-24: 5 mg via ORAL
  Filled 2019-08-24: qty 1

## 2019-08-24 MED ORDER — ACETAMINOPHEN 325 MG PO TABS
650.0000 mg | ORAL_TABLET | Freq: Four times a day (QID) | ORAL | Status: DC | PRN
Start: 2019-08-24 — End: 2019-08-26

## 2019-08-24 MED ORDER — SODIUM CHLORIDE 0.9 % IV SOLN
INTRAVENOUS | Status: DC
Start: 2019-08-24 — End: 2019-08-25

## 2019-08-24 MED ORDER — LORAZEPAM 2 MG/ML IJ SOLN
1.00 mg | Freq: Once | INTRAMUSCULAR | Status: AC
Start: 2019-08-24 — End: 2019-08-24
  Administered 2019-08-24: 09:00:00 1 mg via INTRAVENOUS
  Filled 2019-08-24: qty 1

## 2019-08-24 MED ORDER — FAMOTIDINE 10 MG/ML IV SOLN (WRAP)
20.00 mg | Freq: Once | INTRAVENOUS | Status: AC
Start: 2019-08-24 — End: 2019-08-24
  Administered 2019-08-24: 09:00:00 20 mg via INTRAVENOUS
  Filled 2019-08-24: qty 2

## 2019-08-24 MED ORDER — ONDANSETRON 4 MG PO TBDP
4.00 mg | ORAL_TABLET | Freq: Four times a day (QID) | ORAL | Status: DC | PRN
Start: 2019-08-24 — End: 2019-08-27

## 2019-08-24 MED ORDER — METOCLOPRAMIDE HCL 5 MG/ML IJ SOLN
10.00 mg | Freq: Once | INTRAMUSCULAR | Status: AC
Start: 2019-08-24 — End: 2019-08-24
  Administered 2019-08-24: 11:00:00 10 mg via INTRAVENOUS
  Filled 2019-08-24: qty 2

## 2019-08-24 MED ORDER — FOLIC ACID 5 MG/ML IJ SOLN
Freq: Every day | INTRAMUSCULAR | Status: DC
Start: 2019-08-24 — End: 2019-08-25
  Filled 2019-08-24 (×3): qty 1000

## 2019-08-24 MED ORDER — ONDANSETRON HCL 4 MG/2ML IJ SOLN
4.00 mg | Freq: Four times a day (QID) | INTRAMUSCULAR | Status: DC | PRN
Start: 2019-08-24 — End: 2019-08-27
  Administered 2019-08-24 – 2019-08-25 (×3): 4 mg via INTRAVENOUS
  Filled 2019-08-24 (×3): qty 2

## 2019-08-24 MED ORDER — LORAZEPAM 2 MG/ML IJ SOLN
1.00 mg | INTRAMUSCULAR | Status: DC | PRN
Start: 2019-08-24 — End: 2019-08-27
  Administered 2019-08-24: 19:00:00 1 mg via INTRAVENOUS
  Administered 2019-08-24: 17:00:00 2 mg via INTRAVENOUS
  Administered 2019-08-24 – 2019-08-25 (×3): 1 mg via INTRAVENOUS
  Administered 2019-08-25: 23:00:00 2 mg via INTRAVENOUS
  Administered 2019-08-25 (×2): 1 mg via INTRAVENOUS
  Administered 2019-08-25: 04:00:00 2 mg via INTRAVENOUS
  Administered 2019-08-25 – 2019-08-26 (×4): 1 mg via INTRAVENOUS
  Administered 2019-08-26: 06:00:00 2 mg via INTRAVENOUS
  Administered 2019-08-27 (×2): 1 mg via INTRAVENOUS
  Filled 2019-08-24 (×16): qty 1

## 2019-08-24 MED ORDER — HEPARIN SODIUM (PORCINE) 5000 UNIT/ML IJ SOLN
5000.00 [IU] | Freq: Three times a day (TID) | INTRAMUSCULAR | Status: DC
Start: 2019-08-24 — End: 2019-08-24
  Administered 2019-08-24: 15:00:00 5000 [IU] via SUBCUTANEOUS
  Filled 2019-08-24: qty 1

## 2019-08-24 MED ORDER — HYDROMORPHONE HCL 1 MG/ML IJ SOLN
0.40 mg | INTRAMUSCULAR | Status: DC | PRN
Start: 2019-08-24 — End: 2019-08-25
  Administered 2019-08-25 (×4): 0.4 mg via INTRAVENOUS
  Filled 2019-08-24 (×5): qty 1

## 2019-08-24 MED ORDER — FAMOTIDINE 20 MG PO TABS
20.0000 mg | ORAL_TABLET | Freq: Two times a day (BID) | ORAL | Status: DC
Start: 2019-08-24 — End: 2019-08-27
  Administered 2019-08-26 (×2): 20 mg via ORAL
  Filled 2019-08-24 (×2): qty 1

## 2019-08-24 MED ORDER — MORPHINE SULFATE 2 MG/ML IJ/IV SOLN (WRAP)
2.0000 mg | Status: DC | PRN
Start: 2019-08-24 — End: 2019-08-25
  Administered 2019-08-24 – 2019-08-25 (×4): 2 mg via INTRAVENOUS
  Filled 2019-08-24 (×4): qty 1

## 2019-08-24 MED ORDER — MORPHINE SULFATE 2 MG/ML IJ/IV SOLN (WRAP)
2.0000 mg | Freq: Once | Status: AC
Start: 2019-08-24 — End: 2019-08-24
  Administered 2019-08-24: 09:00:00 2 mg via INTRAVENOUS
  Filled 2019-08-24: qty 1

## 2019-08-24 MED ORDER — LORAZEPAM 2 MG/ML IJ SOLN
1.00 mg | Freq: Once | INTRAMUSCULAR | Status: DC
Start: 2019-08-24 — End: 2019-08-27
  Filled 2019-08-24: qty 1

## 2019-08-24 MED ORDER — NALOXONE HCL 0.4 MG/ML IJ SOLN (WRAP)
0.20 mg | INTRAMUSCULAR | Status: DC | PRN
Start: 2019-08-24 — End: 2019-08-27

## 2019-08-24 MED ORDER — FAMOTIDINE 10 MG/ML IV SOLN (WRAP)
20.00 mg | Freq: Two times a day (BID) | INTRAVENOUS | Status: DC
Start: 2019-08-24 — End: 2019-08-27
  Administered 2019-08-24 – 2019-08-25 (×4): 20 mg via INTRAVENOUS
  Filled 2019-08-24 (×4): qty 2

## 2019-08-24 MED ORDER — LORAZEPAM 2 MG/ML IJ SOLN
1.00 mg | Freq: Once | INTRAMUSCULAR | Status: AC | PRN
Start: 2019-08-24 — End: 2019-08-24
  Administered 2019-08-24: 13:00:00 1 mg via INTRAVENOUS

## 2019-08-24 MED ORDER — HYDROMORPHONE HCL 2 MG PO TABS
2.0000 mg | ORAL_TABLET | ORAL | Status: DC | PRN
Start: 2019-08-24 — End: 2019-08-24

## 2019-08-24 MED ORDER — ONDANSETRON HCL 4 MG/2ML IJ SOLN
4.00 mg | Freq: Once | INTRAMUSCULAR | Status: AC
Start: 2019-08-24 — End: 2019-08-24
  Administered 2019-08-24: 09:00:00 4 mg via INTRAVENOUS
  Filled 2019-08-24: qty 2

## 2019-08-24 NOTE — ED Notes (Signed)
[facility] ED NURSING NOTE FOR THE RECEIVING INPATIENT NURSE   ED NURSE Janey Genta [phone]   ED CHARGE RN [phone]   ADMISSION INFORMATION   Cassandra Gay is a 38 y.o. female admitted with a diagnosis of:    1. Alcohol withdrawal syndrome without complication    2. Transaminitis    3. Alcohol-induced acute pancreatitis, unspecified complication status    4. Elevated bilirubin         Isolation: None   Allergies: Patient has no known allergies.   Holding Orders confirmed? Yes   Belongings Documented? Yes   Home medications sent to pharmacy confirmed? No   NURSING CARE   Mental Status: oriented   ADL: Independent with all ADLs   Ambulation: no difficulty     Personal Protective Equipment (PPE) (F2)     Face Shield and Gloves    Pertinent Information  and Safety Concerns: none     ED Medications: See below, ED ISHAPED (P) Plan for medications administered in the ED   CT / NIH   CT Head ordered on this patient?  No   NIH/Dysphagia assessment done prior to admission? No    VITAL SIGNS   Time BP Temp Pulse Resp SpO2   1330 130/76  105 20 96   IV LINES   IV Catheter Size: 22 g  Peripheral IV 08/24/19 22 G Left Wrist (Active)   Number of days: 0        LAB RESULTS   Labs Reviewed   CBC AND DIFFERENTIAL - Abnormal; Notable for the following components:       Result Value    Platelets 127 (*)     MCV 96.8 (*)     All other components within normal limits    Narrative:     Replace urinary catheter prior to obtaining the urine culture  if it has been in place for greater than or equal to 14  days:->N/A No Foley  Indications for U/A Reflex to Micro - Reflex to  Culture:->Other (please specify in Comments)   COMPREHENSIVE METABOLIC PANEL - Abnormal; Notable for the following components:    Glucose 172 (*)     Chloride 96 (*)     CO2 20 (*)     AST (SGOT) 232 (*)     ALT 135 (*)     Alkaline Phosphatase 210 (*)     Bilirubin, Total 2.4 (*)     Globulin 3.7 (*)     Anion Gap 22.0 (*)     All other components within normal limits     Narrative:     Replace urinary catheter prior to obtaining the urine culture  if it has been in place for greater than or equal to 14  days:->N/A No Foley  Indications for U/A Reflex to Micro - Reflex to  Culture:->Other (please specify in Comments)   LIPASE - Abnormal; Notable for the following components:    Lipase 300 (*)     All other components within normal limits    Narrative:     Replace urinary catheter prior to obtaining the urine culture  if it has been in place for greater than or equal to 14  days:->N/A No Foley  Indications for U/A Reflex to Micro - Reflex to  Culture:->Other (please specify in Comments)   URINALYSIS REFLEX TO MICROSCOPIC EXAM - REFLEX TO CULTURE - Abnormal; Notable for the following components:    Color, UA Amber (*)  Clarity, UA Sl Cloudy (*)     Leukocyte Esterase, UA Trace (*)     Protein, UR >=500 (*)     Bilirubin, UA Small (*)     Blood, UA Large (*)     RBC, UA 26 - 50 (*)     WBC, UA 11 - 25 (*)     Squamous Epithelial Cells, Urine 26 - 50 (*)     Hyaline Casts, UA 11 - 25 (*)     All other components within normal limits    Narrative:     Replace urinary catheter prior to obtaining the urine culture  if it has been in place for greater than or equal to 14  days:->N/A No Foley  Indications for U/A Reflex to Micro - Reflex to  Culture:->Other (please specify in Comments)   BILIRUBIN, DIRECT - Abnormal; Notable for the following components:    Bilirubin Direct 1.2 (*)     All other components within normal limits    Narrative:     Replace urinary catheter prior to obtaining the urine culture  if it has been in place for greater than or equal to 14  days:->N/A No Foley  Indications for U/A Reflex to Micro - Reflex to  Culture:->Other (please specify in Comments)   URINE CULTURE    Narrative:     Replace urinary catheter prior to obtaining the urine culture  if it has been in place for greater than or equal to 14  days:->N/A No Foley  Indications for U/A Reflex to Micro -  Reflex to  Culture:->Other (please specify in Comments)   HEMOLYSIS INDEX    Narrative:     Replace urinary catheter prior to obtaining the urine culture  if it has been in place for greater than or equal to 14  days:->N/A No Foley  Indications for U/A Reflex to Micro - Reflex to  Culture:->Other (please specify in Comments)   GFR    Narrative:     Replace urinary catheter prior to obtaining the urine culture  if it has been in place for greater than or equal to 14  days:->N/A No Foley  Indications for U/A Reflex to Micro - Reflex to  Culture:->Other (please specify in Comments)   ETHANOL   TROPONIN I   URINE BHCG POC SOFT   RAPID DRUG SCREEN, URINE   CBC   RAPID DRUG SCREEN, URINE   POCT PREGNANCY TEST, URINE HCG

## 2019-08-24 NOTE — H&P (Signed)
[facility]      Patient: Cassandra Gay  Date: 08/24/2019   DOB: 04-May-1981  Admission Date: 08/24/2019   MRN: [id number]  Attending: Arnoldo Morale         Chief Complaint   Patient presents with    Abdominal Pain      History Gathered From: patient     HISTORY AND PHYSICAL     Cassandra Gay is a 38 y.o. female with a PMHx of alcohol induced pancreatitis and alcoholism who presented with complaints of abdominal pain since yesterday morning as well as nausea and vomiting.  Patient stated that on Friday she developed nausea and vomiting.  Yesterday morning the patient developed epigastric and right upper quadrant pain, worsening, radiating to her back.  Patient also had 3 episodes of diarrhea yesterday.  She denies fever or chills.  She denies cough, chest pain or shortness of breath.  Patient stated that her symptoms are similar to the symptoms with prior episodes of acute pancreatitis.  Patient stated that last time she had pancreatitis was about 4 months ago.  Patient also stated that she relapsed into alcohol abuse about 1 month ago.  She stated that for the past month she has been drinking about half a gallon of vodka daily.  Her last drink was yesterday morning when she drank 1 shot of vodka.  Patient stated that she has a history of alcohol withdrawal and about a year ago she had a history of withdrawal induced seizures.    Past Medical History:   Diagnosis Date    Hypertension        History reviewed. No pertinent surgical history.    Prior to Admission medications    Not on File       No Known Allergies    CODE STATUS: Full code    PRIMARY CARE MD: Pcp, None, MD    History reviewed. No pertinent family history.    Social History     Tobacco Use    Smoking status: Never Smoker    Smokeless tobacco: Never Used   Substance Use Topics    Alcohol use: Not on file    Drug use: Not on file       REVIEW OF SYSTEMS   Positive for: Abdominal pain, nausea, vomiting, diarrhea  Negative for: Chest  pain, shortness of breath, cough  All ROS completed and otherwise negative.    PHYSICAL EXAM     Vital Signs (most recent): BP 130/76    Pulse (!) 105    Temp 98 F (36.7 C)    Resp 20    Ht 1.753 m (5\' 9" )    Wt 83.5 kg (184 lb)    LMP 08/13/2019    SpO2 96%    BMI 27.17 kg/m   Constitutional: No apparent distress. Patient speaks freely in full sentences.   HEENT: NC/AT, PERRL, no scleral icterus or conjunctival pallor, no nasal discharge, MMM, oropharynx without erythema or exudate  Neck: trachea midline, supple, no cervical or supraclavicular lymphadenopathy or masses  Cardiovascular: Tachycardia, regular rhythm, normal S1 S2, no murmurs, gallops, palpable thrills, no JVD, Non-displaced PMI.  Respiratory: Normal rate. No retractions or increased work of breathing. Clear to auscultation and percussion bilaterally.  Gastrointestinal: +BS, non-distended, soft, tender in epigastric area and right upper quadrant area, no rebound or guarding, no hepatosplenomegaly  Genitourinary: no suprapubic or costovertebral angle tenderness  Musculoskeletal: ROM and motor strength grossly normal. No clubbing, edema, or cyanosis. DP  and radial pulses 2+ and symmetric.  Skin exam:  pink  Neurologic: EOMI, CN 2-12 grossly intact. no gross motor or sensory deficits  Psychiatric: AAOx3, affect and mood appropriate. The patient is alert, interactive, appropriate.  Capillary refill:  Normal    Exam done by Arnoldo Morale, MD on 08/24/19 at 1:46 PM      LABS & IMAGING     Recent Results (from the past 24 hour(s))   Urinalysis Reflex to Microscopic Exam- Reflex to Culture    Collection Time: 08/24/19  8:17 AM   Result Value Ref Range    Urine Type Urine, Clean Ca     Color, UA Amber (A) Colorless - Yellow    Clarity, UA Sl Cloudy (A) Clear - Hazy    Specific Gravity UA 1.029 1.001 - 1.035    Urine pH 6.0 5.0 - 8.0    Leukocyte Esterase, UA Trace (A) Negative    Nitrite, UA Negative Negative    Protein, UR >=500 (A) Negative    Glucose,  UA Negative Negative    Ketones UA 80 Negative    Urobilinogen, UA 2.0 0.2 - 2.0 mg/dL    Bilirubin, UA Small (A) Negative    Blood, UA Large (A) Negative    RBC, UA 26 - 50 (A) 0 - 5 /hpf    WBC, UA 11 - 25 (A) 0 - 5 /hpf    Squamous Epithelial Cells, Urine 26 - 50 (A) 0 - 25 /hpf    Hyaline Casts, UA 11 - 25 (A) 0 - 5 /lpf    Urine Mucus Present None   CBC and differential    Collection Time: 08/24/19  8:36 AM   Result Value Ref Range    WBC 5.56 3.10 - 9.50 x10 3/uL    Hgb 14.2 11.4 - 14.8 g/dL    Hematocrit 62.1 30.8 - 43.7 %    Platelets 127 (L) 142 - 346 x10 3/uL    RBC 4.34 3.90 - 5.10 x10 6/uL    MCV 96.8 (H) 78.0 - 96.0 fL    MCH 32.7 25.1 - 33.5 pg    MCHC 33.8 31.5 - 35.8 g/dL    RDW 14 11 - 15 %    MPV 9.9 8.9 - 12.5 fL    Neutrophils 72.2 None %    Lymphocytes Automated 17.8 None %    Monocytes 8.6 None %    Eosinophils Automated 0.2 None %    Basophils Automated 0.7 None %    Immature Granulocytes 0.5 None %    Nucleated RBC 0.0 0.0 - 0.0 /100 WBC    Neutrophils Absolute 4.01 1.10 - 6.33 x10 3/uL    Lymphocytes Absolute Automated 0.99 0.42 - 3.22 x10 3/uL    Monocytes Absolute Automated 0.48 0.21 - 0.85 x10 3/uL    Eosinophils Absolute Automated 0.01 0.00 - 0.44 x10 3/uL    Basophils Absolute Automated 0.04 0.00 - 0.08 x10 3/uL    Immature Granulocytes Absolute 0.03 0.00 - 0.07 x10 3/uL    Absolute NRBC 0.00 0.00 - 0.00 x10 3/uL   Comprehensive metabolic panel    Collection Time: 08/24/19  8:36 AM   Result Value Ref Range    Glucose 172 (H) 70 - 100 mg/dL    BUN 65.7 7.0 - 84.6 mg/dL    Creatinine 0.9 0.6 - 1.0 mg/dL    Sodium 962 952 - 841 mEq/L    Potassium 3.6 3.5 - 5.1 mEq/L  Chloride 96 (L) 100 - 111 mEq/L    CO2 20 (L) 22 - 29 mEq/L    Calcium 9.5 8.5 - 10.5 mg/dL    Protein, Total 8.0 6.0 - 8.3 g/dL    Albumin 4.3 3.5 - 5.0 g/dL    AST (SGOT) 161 (H) 5 - 34 U/L    ALT 135 (H) 0 - 55 U/L    Alkaline Phosphatase 210 (H) 37 - 106 U/L    Bilirubin, Total 2.4 (H) 0.2 - 1.2 mg/dL    Globulin 3.7 (H)  2.0 - 3.6 g/dL    Albumin/Globulin Ratio 1.2 0.9 - 2.2    Anion Gap 22.0 (H) 5.0 - 15.0   Lipase    Collection Time: 08/24/19  8:36 AM   Result Value Ref Range    Lipase 300 (H) 8 - 78 U/L   Hemolysis index    Collection Time: 08/24/19  8:36 AM   Result Value Ref Range    Hemolysis Index 15 0 - 18   GFR    Collection Time: 08/24/19  8:36 AM   Result Value Ref Range    EGFR >60.0    Ethanol (Alcohol) Level    Collection Time: 08/24/19  8:36 AM   Result Value Ref Range    Alcohol None Detected None Detected mg/dL   Troponin I    Collection Time: 08/24/19  8:36 AM   Result Value Ref Range    Troponin I <0.01 0.00 - 0.05 ng/mL   Bilirubin, direct    Collection Time: 08/24/19  8:36 AM   Result Value Ref Range    Bilirubin Direct 1.2 (H) 0.0 - 0.5 mg/dL   Urine BHCG POC    Collection Time: 08/24/19  9:34 AM   Result Value Ref Range    Urine bHCG POC Negative Negative       MICROBIOLOGY:  Antibiotics Started: None    IMAGING:  Upon my review:     US Abdomen Limited RUQ (After CT ABD) [[id number]] (Order [id number])   Status: Final result   Study Result     History: ruq abd ttp, GB distended on ct scan    Findings: Sonographic evaluation of the abdomen is performed.    The gallbladder is distended. No discrete stone is seen. No adjacent  fluid is seen. There is no common bile duct dilatation. The common bile  duct measures 4 mm in diameter. The pancreas heterogeneous compatible  with pancreatitis with mild pancreatic ductal dilatation    IMPRESSION:    No sonographically identifiable cholelithiasis or biliary  ductal dilatation.    Laurena Slimmer, MD   08/24/2019 12:10 PM       CT Abd/Pelvis with IV Contrast [[id number]] (Order [id number])   Status: Final result   Study Result     CT ABDOMEN PELVIS W IV/ WO PO CONT    CLINICAL INDICATION:   upper abd pain, eval pancreas and GB    COMPARISON: None    TECHNIQUE: 5 mm axial images through the abdomen and pelvis following  intravenous contrast administration with sagittal and  coronal  reformatted images. Approximately 100cc of Omnipaque 350 was  administered intravenously. The following dose reduction techniques were  utilized: automated exposure control and/or adjustment of the mA  and/or kV according to patient size, and the use of iterative  reconstruction technique.    FINDINGS:      LUNG BASES: There is a mild amount of atelectasis and scarring in the  included lung bases.  ABDOMEN: The gallbladder appears distended but otherwise grossly  unremarkable. There is diffuse fatty infiltration of the liver. The  liver, spleen, adrenal glands, and kidneys otherwise appear  unremarkable. There is nonspecific stranding in the peripancreatic fat  suspicious for pancreatitis. There is a markedly atrophic appearance of  the pancreatic tail. There are heterogeneous areas of hypoenhancement  involving cranial portions of the pancreatic body and neck suspicious  for pancreatic necrosis. There is no evidence for well-defined  peripancreatic fluid collection. There is no free fluid or free  intraperitoneal air in the abdomen. There are multiple subcentimeter  upper abdominal lymph nodes.    PELVIS: There is no evidence for free pelvic fluid. The urinary bladder  appears grossly unremarkable for degree of distention. The uterus and  adnexa appear grossly unremarkable within the limitations of technique.  Evaluation of the small bowel and colon is limited given lack of oral  contrast opacification. The small bowel and colon appear grossly  unremarkable within the limitations of lack of oral contrast  opacification. The visualized portions of the appendix appear within  normal limits. Bone windows demonstrate degenerative changes in the  spine and pelvis. There is associated irregularity of the left  sacroiliac joint with asymmetric widening and subchondral sclerosis  suspicious for inflammatory arthropathy.    IMPRESSION:        1. Acute pancreatitis with findings suspicious for pancreatic  necrosis  as described above. Clinical correlation with pancreatic enzyme levels  is recommended.    2. Fatty infiltration of the liver and additional findings as described  above.    Sandie Ano, MD   08/24/2019 10:20 AM         CARDIAC:  EKG Interpretation (upon my review):   SINUS TACHYCARDIA  BIATRIAL ENLARGEMENT  LOW VOLTAGE QRS  Cannot rule out ANTERIOR MYOCARDIAL INFARCTION , AGE UNDETERMINED  ABNORMAL ECG  NO PREVIOUS ECGS AVAILABLE    Markers:  Recent Labs   Lab 08/24/19  0836   Troponin I <0.01       EMERGENCY DEPARTMENT COURSE:  Orders Placed This Encounter   Procedures    Urine culture    CT Abd/Pelvis with IV Contrast    US Abdomen Limited RUQ (After CT ABD)    CBC and differential    Comprehensive metabolic panel    Lipase    Urinalysis Reflex to Microscopic Exam- Reflex to Culture    Hemolysis index    GFR    Ethanol (Alcohol) Level    Urine Tox Screen (Rapid Drug Screen)    Troponin I    Bilirubin, direct    Urine BHCG POC    Phosphorus    Magnesium    CBC without differential    Rapid drug screen, urine (If not already done)    Diet NPO effective now    NSG Communication:Urine BHGC POCT    CIWA LORazepam Nursing Communication    Cardiac monitoring (ED Only)    Pulse Oximetry    Notify physician (specify)    CIWA LORazepam Nursing Communication    Education: Alcohol Intake    Orthostatic blood pressure    Advance diet as tolerated    Full Code    ED Unit Sec Comm Order    Cats Consult    POCT Pregnancy Test, Urine HCG (If not already done)    ECG 12 Lead    ECG 12 lead (If not already done)    Saline lock IV    Place (admit) for Observation  Services    [facility] ED Bed Request (Observation)    Fall precautions    Aspiration precautions    Seizure precautions    Seizure precautions    Fall precautions    Aspiration precautions       ASSESSMENT & PLAN     Cassandra Gay is a 38 y.o. female admitted under [facility] physicians with acute  pancreatitis.    Patient Active Hospital Problem List:    1.  Alcohol induced acute pancreatitis with suspicious necrosis.  Imaging studies did not show gallstones.  We'll order NPO, IVF with NS. we'll order zofran for pain control and morphine and dilaudid for pain control.   Considering complicated nature of pancreatitis we'll consult GI.    2. Alcohol withdrawal syndrome without complication (08/24/2019).      Assessment and plan: We will admit to telemetry; we will initiate CIWA protocol with Lorazepam as needed.    Will order banana bag and thiamine daily    Patient reports history of withdrawal seizures.  Will monitor patient closely.  We will order seizure precautions    3.  Transaminase elevation.  Most likely alcohol induced.  Will monitor    4.  History of hypertension.  Will reorder patient's home medications once verified.    5.  Abnormal urinalysis.  Patient denies having any symptoms related to urinary tract infection.  We will repeat urinalysis.    Nutrition  N.p.o.    DVT/VTE Prophylaxis  SCDs    Anticipated medical stability for discharge: To be determined    Service status/Reason for ongoing hospitalization: Acute pancreatitis and alcohol withdrawal  Signed,  Sarely Stracener A Carmelle Bamberg    08/24/2019 1:46 PM  Time Elapsed:65 minutes

## 2019-08-24 NOTE — ED Triage Notes (Signed)
Pt comes to ED for RUQ abdominal pain and palpitations.  PT has HX of Pancreatitis.    Visit Vitals  BP (!) 137/98   Pulse (!) 153   Temp 97.7 F (36.5 C) (Oral)   Resp 20   Ht 5\' 9"  (1.753 m)   Wt 83.5 kg   LMP 08/13/2019   SpO2 96%   BMI 27.17 kg/m

## 2019-08-24 NOTE — Plan of Care (Addendum)
Problem: Safety  Goal: Patient will be free from injury during hospitalization  Outcome: Progressing  Flowsheets (Taken 08/24/2019 2002)  Patient will be free from injury during hospitalization:   Assess patient's risk for falls and implement fall prevention plan of care per policy   Provide and maintain safe environment   Use appropriate transfer methods   Ensure appropriate safety devices are available at the bedside   Assess for patients risk for elopement and implement Elopement Risk Plan per policy  Goal: Patient will be free from infection during hospitalization  Flowsheets (Taken 08/24/2019 2002)  Free from Infection during hospitalization:   Assess and monitor for signs and symptoms of infection   Monitor lab/diagnostic results   Monitor all insertion sites (i.e. indwelling lines, tubes, urinary catheters, and drains)   Encourage patient and family to use good hand hygiene technique     Problem: Pain  Goal: Pain at adequate level as identified by patient  Flowsheets (Taken 08/24/2019 2002)  Pain at adequate level as identified by patient:   Identify patient comfort function goal   Assess pain on admission, during daily assessment and/or before any "as needed" intervention(s)   Reassess pain within 30-60 minutes of any procedure/intervention, per Pain Assessment, Intervention, Reassessment (AIR) Cycle   Evaluate if patient comfort function goal is met   Evaluate patient's satisfaction with pain management progress   Offer non-pharmacological pain management interventions       Patient is A&Ox 4,  BP:126/97- 136/95, HR: 110-118, Sinus Rhythm- Sinus Tach on tele monitor.  O2 : 96-98% on RA. Temperature 98-98.2.      Patient complained of nausea and experienced vomiting upon admission. N/V continued, Zofran given, vomiting resolved, nausea continues.     Patient complained of RUQ pain,   Oxycodone given, and heat packs applied.   Patient on CIWA protocol, CIWA scores :7-16, Ativan given.     Will  continue to follow care plan, monitor vitals, labs, continue CIWA protocol. All patients questions and concerns  addressed.

## 2019-08-24 NOTE — Plan of Care (Signed)
Problem: Side Effects from Pain Analgesia  Goal: Patient will experience minimal side effects of analgesic therapy  Outcome: Progressing  Flowsheets (Taken 08/24/2019 2112)  Patient will experience minimal side effects of analgesic therapy:   Monitor/assess patient's respiratory status (RR depth, effort, breath sounds)   Assess for changes in cognitive function   Prevent/manage side effects per LIP orders (i.e. nausea, vomiting, pruritus, constipation, urinary retention, etc.)   Evaluate for opioid-induced sedation with appropriate assessment tool (i.e. POSS)     Problem: Addiction to alcohol or opioids or other substances AS EVIDENCED BY:  Goal: Patient achieves a safe detoxification and management of withdrawal symptoms  Outcome: Progressing  Flowsheets (Taken 08/24/2019 2112)  Patient achieves a safe detoxification and management of withdrawal symptoms:   Assess withdrawal signs/symptoms according to identified protocol (e.g. CIWA/COWS)   Provide medication teaching including name, dosage, benefits, action, effect and side effects   Assess effectiveness (relief of withdrawal symptoms) and side effects of medication   Educate about health risks associated with withdrawal (e.g. seizures, DTs)   Ensure laboratory results are reviewed with the LIP to increase understanding of the medical consequences of addiction   Ensure adequate hydration and nutrition during withdrawal   Educate about the importance of reporting dizziness or unsteadiness while ambulating to care providers (e.g. RN, LIP)     Problem: Altered GI Function  Goal: Fluid and electrolyte balance are achieved/maintained  Outcome: Progressing  Flowsheets (Taken 08/24/2019 2112)  Fluid and electrolyte balance are achieved/maintained:   Monitor/assess lab values and report abnormal values   Provide adequate hydration   Monitor intake and output every shift   Assess for confusion/personality changes   Assess and reassess fluid and electrolyte status   Observe for  seizure activity and initiate seizure precautions if indicated   Observe for cardiac arrhythmias   Monitor for muscle weakness     Problem: Altered GI Function  Goal: Nutritional intake is adequate  Outcome: Progressing  Flowsheets (Taken 08/24/2019 2112)  Nutritional intake is adequate:   Assist patient with meals/food selection   Allow adequate time for meals   Encourage/perform oral hygiene as appropriate     Pt arrived on unit from the ED. Pt oriented to room, unit, and call bell system. Head to toe performed. Orders released and implemented.  Admission hx completed. The learning abilities of the patient have been assessed. Today's individualized plan of care includes monitoring vitals, telemetry, pain, and implement safety precautions. The patient agree to the plan of care and demonstrate understanding of the disease process, treatment plan, medications, and consequences of noncompliance. All questions and concerns were addressed. Will continue to monitor and with plan of care.

## 2019-08-24 NOTE — ED Provider Notes (Signed)
EMERGENCY DEPARTMENT HISTORY AND PHYSICAL EXAM     None        Date: 08/24/2019  Patient Name: Cassandra Gay  Attending Physician: Arnoldo Morale, MD  Advanced Practice Provider: Alverda Skeans, PA-C    History of Presenting Illness       History Provided By: Patient    Chief Complaint:  Chief Complaint   Patient presents with    Abdominal Pain         Additional History: Cassandra Gay is a 38 y.o. female presenting to the ED with moderate to severe upper abd pain radiating to her back and right shoulder with associated multiple episodes of non-bloody vomiting- starting a few days ago. Pt denies any coffee ground emesis. 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 Her last episode was 4 months ago and she stopped drinking then, but recently restarted. She also has hx of alcohol withdrawal seizures and states that yesterday she felt shaky, so she tried to drink etoh at 6 AM, but she vomited immediately afterwards. She has not had any alcohol since then. She reports normal stools, no blood or melena, no fever, or chills. Pt does have chest pain radiating up from the right side of her abd, but no CP otherwise. She denies any SOB or cough. Pt did not take any meds PTA. She has hx of C sections x 3, with no other abdominal surgeries.       PCP: Pcp, None, MD  SPECIALISTS:    Current Facility-Administered Medications   Medication Dose Route Frequency Provider Last Rate Last Dose    0.9% NaCl infusion   Intravenous Continuous Vic Blackbird, Dmitri A, MD 100 mL/hr at 08/24/19 1450      acetaminophen (TYLENOL) tablet 650 mg  650 mg Oral Q6H PRN Nelva Nay A, MD        famotidine (PEPCID) tablet 20 mg  20 mg Oral Q12H [facility] Gagarin, Dmitri A, MD        Or    famotidine (PEPCID) injection 20 mg  20 mg Intravenous Q12H [facility] Gagarin, Dmitri A, MD   20 mg at 08/24/19 1448    [START ON 08/27/2019] folic acid (FOLVITE) tablet 1 mg  1 mg Oral Daily Gagarin, Dmitri A, MD         HYDROmorphone (DILAUDID) injection 0.4 mg  0.4 mg Intravenous Q4H PRN Vic Blackbird, Dmitri A, MD        LORazepam (ATIVAN) injection 1 mg  1 mg Intravenous Once Alverda Skeans A, PA        LORazepam (ATIVAN) injection 1-2 mg  1-2 mg Intravenous Q1H PRN Nelva Nay A, MD   1 mg at 08/24/19 1855    morphine injection 2 mg  2 mg Intravenous Q4H PRN Arnoldo Morale, MD        [START ON 08/27/2019] multivitamin tablet 1 tablet  1 tablet Oral Daily Gagarin, Dmitri A, MD        naloxone (NARCAN) injection 0.2 mg  0.2 mg Intravenous PRN Vic Blackbird, Dmitri A, MD        ondansetron (ZOFRAN-ODT) disintegrating tablet 4 mg  4 mg Oral Q6H PRN Vic Blackbird, Dmitri A, MD        Or    ondansetron (ZOFRAN) injection 4 mg  4 mg Intravenous Q6H PRN Vic Blackbird, Dmitri A, MD   4 mg at 08/24/19 1446    sodium chloride 0.9 % 1,000 mL with thiamine 100 mg, folic acid  1 mg, m.v.i. adult 10 mL infusion   Intravenous Daily Vic Blackbird, Dmitri A, MD 100 mL/hr at 08/24/19 1855      [START ON 08/27/2019] thiamine (VITAMIN B1) tablet 100 mg  100 mg Oral Daily Nelva Nay A, MD           At the time of evaluation in the ED, the patient does not meet criteria for COVID 19 testing.     PPE: goggles, N95 or respirator, surgical cap, gloves  Past History     Past Medical History:  Past Medical History:   Diagnosis Date    Hypertension        Past Surgical History:  History reviewed. No pertinent surgical history.    Family History:  History reviewed. No pertinent family history.    Social History:  Social History     Tobacco Use    Smoking status: Never Smoker    Smokeless tobacco: Never Used   Substance Use Topics    Alcohol use: Not on file    Drug use: Not on file       Allergies:  No Known Allergies    Review of Systems     Review of Systems   Constitutional: Negative for chills and fever.   HENT: Negative for congestion.    Eyes: Negative for pain.   Respiratory: Negative for cough and shortness of breath.    Cardiovascular: Positive for chest  pain.   Gastrointestinal: Positive for abdominal pain and vomiting. Negative for blood in stool, diarrhea and nausea.   Musculoskeletal: Positive for back pain.   Skin: Negative for rash.   Neurological: Negative for syncope.   Psychiatric/Behavioral: Negative for agitation.       Physical Exam     Vitals:    08/24/19 1408 08/24/19 1655 08/24/19 1844 08/24/19 1900   BP: (!) 126/97 (!) 136/95 (!) 135/92 (!) 138/97   Pulse: (!) 118 (!) 110 (!) 111 (!) 112   Resp: 18 18 16 16    Temp: 98.2 F (36.8 C) 98.1 F (36.7 C) 98.1 F (36.7 C) 98.1 F (36.7 C)   TempSrc: Oral   Oral   SpO2: 98%   98%   Weight: 85.7 kg      Height: 5\' 9"  (1.753 m)          Physical Exam   Constitutional: She is oriented to person, place, and time. She appears distressed.   Appears in mild distress secondary to pain.   HENT:   Head: Normocephalic and atraumatic.   Mouth/Throat: Mucous membranes are dry.   Eyes: Conjunctivae and EOM are normal.   Neck: Normal range of motion. Neck supple.   Cardiovascular: Normal heart sounds. Tachycardia present.   Pulmonary/Chest: Effort normal and breath sounds normal.   Abdominal: There is abdominal tenderness in the right upper quadrant and left upper quadrant. There is guarding.   Tender to diffuse upper abd, RUQ greater than LUQ, with voluntary guarding.   Genitourinary:    Genitourinary Comments: Urine at bedside appears very dark.     Musculoskeletal: Normal range of motion.         General: No edema.   Neurological: She is alert and oriented to person, place, and time. She displays tremor.   Fine tremors to both hands. Tongue fasciculations.    Skin: Skin is warm and dry.   Psychiatric: Her mood appears anxious.   Nursing note and vitals reviewed.       Diagnostic Study Results  Labs -     Results     Procedure Component Value Units Date/Time    COVID-19 (SARS-COV-2) ([place] Standard test) [[id number]] Collected:  08/24/19 1732    Specimen:  Nasopharyngeal Swab from Nasopharynx Updated:  08/24/19  1733    Narrative:       o Collect and clearly label specimen type:  o PREFERRED-Upper respiratory specimen: One Nasopharyngeal  Swab in Transport Media.  o Hand deliver to laboratory ASAP    Bilirubin, direct [[id number]]  (Abnormal) Collected:  08/24/19 0836     Updated:  08/24/19 0952     Bilirubin Direct 1.2 mg/dL     Narrative:       Replace urinary catheter prior to obtaining the urine culture  if it has been in place for greater than or equal to 14  days:->N/A No Foley  Indications for U/A Reflex to Micro - Reflex to  Culture:->Other (please specify in Comments)    Urine BHCG POC [[id number]] Collected:  08/24/19 0934     Updated:  08/24/19 0940     Urine bHCG POC Negative    Troponin I [[id number]] Collected:  08/24/19 0836    Specimen:  Blood Updated:  08/24/19 0921     Troponin I <0.01 ng/mL     Hemolysis index [[id number]] Collected:  08/24/19 0836     Updated:  08/24/19 0915     Hemolysis Index 15    Narrative:       Replace urinary catheter prior to obtaining the urine culture  if it has been in place for greater than or equal to 14  days:->N/A No Foley  Indications for U/A Reflex to Micro - Reflex to  Culture:->Other (please specify in Comments)    GFR [[id number]] Collected:  08/24/19 0836     Updated:  08/24/19 0915     EGFR >60.0    Narrative:       Replace urinary catheter prior to obtaining the urine culture  if it has been in place for greater than or equal to 14  days:->N/A No Foley  Indications for U/A Reflex to Micro - Reflex to  Culture:->Other (please specify in Comments)    Comprehensive metabolic panel [[id number]]  (Abnormal) Collected:  08/24/19 0836    Specimen:  Blood Updated:  08/24/19 0915     Glucose 172 mg/dL      BUN 29.5 mg/dL      Creatinine 0.9 mg/dL      Sodium 188 mEq/L      Potassium 3.6 mEq/L      Chloride 96 mEq/L      CO2 20 mEq/L      Calcium 9.5 mg/dL      Protein, Total 8.0 g/dL      Albumin 4.3 g/dL      AST (SGOT) 416 U/L      ALT 135 U/L      Alkaline Phosphatase 210 U/L       Bilirubin, Total 2.4 mg/dL      Globulin 3.7 g/dL      Albumin/Globulin Ratio 1.2     Anion Gap 22.0    Narrative:       Replace urinary catheter prior to obtaining the urine culture  if it has been in place for greater than or equal to 14  days:->N/A No Foley  Indications for U/A Reflex to Micro - Reflex to  Culture:->Other (please specify in Comments)    Lipase [[id number]]  (Abnormal) Collected:  08/24/19 0932  Specimen:  Blood Updated:  08/24/19 0915     Lipase 300 U/L     Narrative:       Replace urinary catheter prior to obtaining the urine culture  if it has been in place for greater than or equal to 14  days:->N/A No Foley  Indications for U/A Reflex to Micro - Reflex to  Culture:->Other (please specify in Comments)    Ethanol (Alcohol) Level [[id number]] Collected:  08/24/19 0836    Specimen:  Blood Updated:  08/24/19 0913     Alcohol None Detected mg/dL     CBC and differential [[id number]]  (Abnormal) Collected:  08/24/19 0836    Specimen:  Blood Updated:  08/24/19 0903     WBC 5.56 x10 3/uL      Hgb 14.2 g/dL      Hematocrit 78.2 %      Platelets 127 x10 3/uL      RBC 4.34 x10 6/uL      MCV 96.8 fL      MCH 32.7 pg      MCHC 33.8 g/dL      RDW 14 %      MPV 9.9 fL      Neutrophils 72.2 %      Lymphocytes Automated 17.8 %      Monocytes 8.6 %      Eosinophils Automated 0.2 %      Basophils Automated 0.7 %      Immature Granulocytes 0.5 %      Nucleated RBC 0.0 /100 WBC      Neutrophils Absolute 4.01 x10 3/uL      Lymphocytes Absolute Automated 0.99 x10 3/uL      Monocytes Absolute Automated 0.48 x10 3/uL      Eosinophils Absolute Automated 0.01 x10 3/uL      Basophils Absolute Automated 0.04 x10 3/uL      Immature Granulocytes Absolute 0.03 x10 3/uL      Absolute NRBC 0.00 x10 3/uL     Narrative:       Replace urinary catheter prior to obtaining the urine culture  if it has been in place for greater than or equal to 14  days:->N/A No Foley  Indications for U/A Reflex to Micro - Reflex  to  Culture:->Other (please specify in Comments)    Urinalysis Reflex to Microscopic Exam- Reflex to Culture [[id number]]  (Abnormal) Collected:  08/24/19 0817    Specimen:  Urine Updated:  08/24/19 0851     Urine Type Urine, Clean Ca     Color, UA Amber     Clarity, UA Sl Cloudy     Specific Gravity UA 1.029     Urine pH 6.0     Leukocyte Esterase, UA Trace     Nitrite, UA Negative     Protein, UR >=500     Glucose, UA Negative     Ketones UA 80     Urobilinogen, UA 2.0 mg/dL      Bilirubin, UA Small     Blood, UA Large     RBC, UA 26 - 50 /hpf      WBC, UA 11 - 25 /hpf      Squamous Epithelial Cells, Urine 26 - 50 /hpf      Hyaline Casts, UA 11 - 25 /lpf      Urine Mucus Present    Narrative:       Replace urinary catheter prior to obtaining the urine culture  if it has been in  place for greater than or equal to 14  days:->N/A No Foley  Indications for U/A Reflex to Micro - Reflex to  Culture:->Other (please specify in Comments)          Radiologic Studies -   Radiology Results (24 Hour)     Procedure Component Value Units Date/Time    US Abdomen Limited RUQ (After CT ABD) [[id number]] Collected:  08/24/19 1208    Order Status:  Completed Updated:  08/24/19 1212    Narrative:       History: ruq abd ttp, GB distended on ct scan    Findings: Sonographic evaluation of the abdomen is performed.    The gallbladder is distended. No discrete stone is seen. No adjacent  fluid is seen. There is no common bile duct dilatation. The common bile  duct measures 4 mm in diameter. The pancreas heterogeneous compatible  with pancreatitis with mild pancreatic ductal dilatation      Impression:        No sonographically identifiable cholelithiasis or biliary  ductal dilatation.    Laurena Slimmer, MD   08/24/2019 12:10 PM    CT Abd/Pelvis with IV Contrast [[id number]] Collected:  08/24/19 1005    Order Status:  Completed Updated:  08/24/19 1022    Narrative:       CT ABDOMEN PELVIS W IV/ WO PO CONT    CLINICAL INDICATION:   upper abd pain,  eval pancreas and GB    COMPARISON: None    TECHNIQUE: 5 mm axial images through the abdomen and pelvis following  intravenous contrast administration with sagittal and coronal  reformatted images. Approximately 100cc of Omnipaque 350 was  administered intravenously. The following dose reduction techniques were  utilized: automated exposure control and/or adjustment of the mA  and/or kV according to patient size, and the use of iterative  reconstruction technique.    FINDINGS:      LUNG BASES: There is a mild amount of atelectasis and scarring in the  included lung bases.      ABDOMEN: The gallbladder appears distended but otherwise grossly  unremarkable. There is diffuse fatty infiltration of the liver. The  liver, spleen, adrenal glands, and kidneys otherwise appear  unremarkable. There is nonspecific stranding in the peripancreatic fat  suspicious for pancreatitis. There is a markedly atrophic appearance of  the pancreatic tail. There are heterogeneous areas of hypoenhancement  involving cranial portions of the pancreatic body and neck suspicious  for pancreatic necrosis. There is no evidence for well-defined  peripancreatic fluid collection. There is no free fluid or free  intraperitoneal air in the abdomen. There are multiple subcentimeter  upper abdominal lymph nodes.    PELVIS: There is no evidence for free pelvic fluid. The urinary bladder  appears grossly unremarkable for degree of distention. The uterus and  adnexa appear grossly unremarkable within the limitations of technique.  Evaluation of the small bowel and colon is limited given lack of oral  contrast opacification. The small bowel and colon appear grossly  unremarkable within the limitations of lack of oral contrast  opacification. The visualized portions of the appendix appear within  normal limits. Bone windows demonstrate degenerative changes in the  spine and pelvis. There is associated irregularity of the left  sacroiliac joint with asymmetric  widening and subchondral sclerosis  suspicious for inflammatory arthropathy.      Impression:            1. Acute pancreatitis with findings suspicious for pancreatic necrosis  as described  above. Clinical correlation with pancreatic enzyme levels  is recommended.    2. Fatty infiltration of the liver and additional findings as described  above.    Sandie Ano, MD   08/24/2019 10:20 AM      .    Medical Decision Making   I am the first provider for this patient.    I reviewed the vital signs, available nursing notes, past medical history, past surgical history, family history and social history.    Vital Signs-Reviewed the patient's vital signs.     Patient Vitals for the past 12 hrs:   BP Temp Pulse Resp   08/24/19 1900 (!) 138/97 98.1 F (36.7 C) (!) 112 16   08/24/19 1844 (!) 135/92 98.1 F (36.7 C) (!) 111 16   08/24/19 1655 (!) 136/95 98.1 F (36.7 C) (!) 110 18   08/24/19 1408 (!) 126/97 98.2 F (36.8 C) (!) 118 18   08/24/19 1330 130/76 -- (!) 105 20   08/24/19 1030 (!) 141/101 98 F (36.7 C) (!) 112 20   08/24/19 1000 (!) 148/97 -- 99 16   08/24/19 0948 -- -- -- (!) 24   08/24/19 0930 -- -- (!) 101 20   08/24/19 0855 -- -- (!) 106 18       Pulse Oximetry Analysis - Normal SpO2: 96 % on RA    EKG:  Sepsis:       Please choose ONLY A, B, or C for your sepsis documentation      A. ------------Severe Sepsis/Septic Shock Exclusion----------------------------    Pt is excluded from severe sepsis or septic shock consideration at the time of admission at 1308 [enter time] because of one or more reasons below:    All SIRS, abnl vitals, organ dysfunction are NOT due to severe sepsis or septic shock, but due to alternative cause: dehydration, alcohol withdrawal [list cause].     The timestamp from the preceding paragraph above also applies to every infectious and/or SEP-1-related diagnosis in Clinical Impression or MDM sections of this note.           Critical Care:        Procedures:         Old Medical Records:  Old medical records.       ED Medications  Medications   LORazepam (ATIVAN) injection 1 mg (1 mg Intravenous Not Given 08/24/19 1450)   naloxone (NARCAN) injection 0.2 mg (has no administration in time range)   0.9% NaCl infusion ( Intravenous New Bag 08/24/19 1450)   ondansetron (ZOFRAN-ODT) disintegrating tablet 4 mg ( Oral See Alternative 08/24/19 1446)     Or   ondansetron (ZOFRAN) injection 4 mg (4 mg Intravenous Given 08/24/19 1446)   famotidine (PEPCID) tablet 20 mg ( Oral See Alternative 08/24/19 1448)     Or   famotidine (PEPCID) injection 20 mg (20 mg Intravenous Given 08/24/19 1448)   acetaminophen (TYLENOL) tablet 650 mg (has no administration in time range)   folic acid (FOLVITE) tablet 1 mg (has no administration in time range)   multivitamin tablet 1 tablet (has no administration in time range)   thiamine (VITAMIN B1) tablet 100 mg (has no administration in time range)   LORazepam (ATIVAN) injection 1-2 mg (1 mg Intravenous Given 08/24/19 1855)   sodium chloride 0.9 % 1,000 mL with thiamine 100 mg, folic acid 1 mg, m.v.i. adult 10 mL infusion ( Intravenous New Bag 08/24/19 1855)   HYDROmorphone (DILAUDID) injection 0.4 mg (0.4 mg Intravenous  Not Given 08/24/19 1850)   morphine injection 2 mg (has no administration in time range)   sodium chloride 0.9 % bolus 2,000 mL (0 mLs Intravenous Stopped 08/24/19 1313)   ondansetron (ZOFRAN) injection 4 mg (4 mg Intravenous Given 08/24/19 0839)   LORazepam (ATIVAN) injection 1 mg (1 mg Intravenous Given 08/24/19 0841)   morphine injection 2 mg (2 mg Intravenous Given 08/24/19 0838)   morphine injection 4 mg (4 mg Intravenous Given 08/24/19 1024)   LORazepam (ATIVAN) injection 1 mg (1 mg Intravenous Given 08/24/19 1311)   famotidine (PEPCID) injection 20 mg (20 mg Intravenous Given 08/24/19 0840)   iohexol (OMNIPAQUE) 350 MG/ML injection 100 mL (100 mLs Intravenous Imaging Agent Given 08/24/19 1003)   metoclopramide (REGLAN) injection 10 mg (10 mg Intravenous Given 08/24/19 1101)   morphine  injection 4 mg (4 mg Intravenous Given 08/24/19 1143)       ED Course:   ED Course as of Aug 24 2047   Sun Aug 24, 2019   1012 Pt resting comfortably, heartrate improved, has not required prn Ativan yet.    [SS]   1043 Pt is feeling better.     [JM]   1137 Discussed all results with patient, still with significant pain. Will remedicate, [place] ruq, reassess    [JG]   1305 Pt amenable to admission, reports some anxiety.    [SS]      ED Course User Index  [JG] Volanda Napoleon, PA  [JM] Tonna Corner  [SS] Renda Rolls     D/w Dr. Vic Blackbird, will admit to [facility].    Patient was not tested for COVID 19. (if tested, D-dimer, ferritin, CRP, EKG, contact/droplet isolation orders).    Provider Notes:     History and labs consistent with acute pancreatitis, as well as concern for alcohol withdrawal given nausea, vomiting, anxiety and tremors.  Gallbladder distended on CT scan, without any evidence of acute cholecystitis on ultrasound.  Given no urinary symptoms and multiple squamous epithelial cells, do not suspect UTI, culture sent.  Patient required only 3 doses of Ativan 1 mg in the ED, as per discussion with Dr. Vic Blackbird, admit to [facility].     D/w Suzy Bouchard, MD      Diagnosis     Clinical Impression:   1. Alcohol withdrawal syndrome without complication    2. Transaminitis    3. Alcohol-induced acute pancreatitis, unspecified complication status    4. Elevated bilirubin        Treatment Plan:   ED Disposition     ED Disposition Condition Date/Time Comment    Observation  Sun Aug 24, 2019  1:08 PM Admitting Physician: Arnoldo Morale [[id number]]   Diagnosis: Alcohol withdrawal syndrome without complication [[id number]]   Estimated Length of Stay: < 2 midnights   Tentative Discharge Plan?: Home or Self Care [1]   Patient Class: Observation [104]              _______________________________    CHART OWNERSHIPAnastasia Pall, PA-C, am the primary clinician of record.  _______________________________       Volanda Napoleon,  PA  08/24/19 2050       Suzy Bouchard, MD  08/28/19 1722

## 2019-08-25 DIAGNOSIS — R197 Diarrhea, unspecified: Secondary | ICD-10-CM | POA: Diagnosis present

## 2019-08-25 DIAGNOSIS — F1210 Cannabis abuse, uncomplicated: Secondary | ICD-10-CM | POA: Diagnosis present

## 2019-08-25 DIAGNOSIS — F1010 Alcohol abuse, uncomplicated: Secondary | ICD-10-CM | POA: Diagnosis present

## 2019-08-25 DIAGNOSIS — E876 Hypokalemia: Secondary | ICD-10-CM | POA: Diagnosis present

## 2019-08-25 DIAGNOSIS — R569 Unspecified convulsions: Secondary | ICD-10-CM | POA: Diagnosis present

## 2019-08-25 DIAGNOSIS — D696 Thrombocytopenia, unspecified: Secondary | ICD-10-CM | POA: Diagnosis present

## 2019-08-25 DIAGNOSIS — E8729 Other acidosis: Secondary | ICD-10-CM | POA: Diagnosis present

## 2019-08-25 DIAGNOSIS — K7010 Alcoholic hepatitis without ascites: Secondary | ICD-10-CM | POA: Diagnosis present

## 2019-08-25 DIAGNOSIS — K859 Acute pancreatitis without necrosis or infection, unspecified: Secondary | ICD-10-CM

## 2019-08-25 LAB — COMPREHENSIVE METABOLIC PANEL
ALT: 94 U/L — ABNORMAL HIGH (ref 0–55)
AST (SGOT): 129 U/L — ABNORMAL HIGH (ref 5–34)
Albumin/Globulin Ratio: 1.1 (ref 0.9–2.2)
Albumin: 3.7 g/dL (ref 3.5–5.0)
Alkaline Phosphatase: 179 U/L — ABNORMAL HIGH (ref 37–106)
Anion Gap: 21 — ABNORMAL HIGH (ref 5.0–15.0)
BUN: 6 mg/dL — ABNORMAL LOW (ref 7.0–19.0)
Bilirubin, Total: 1.7 mg/dL — ABNORMAL HIGH (ref 0.2–1.2)
CO2: 15 mEq/L — ABNORMAL LOW (ref 22–29)
Calcium: 8.3 mg/dL — ABNORMAL LOW (ref 8.5–10.5)
Chloride: 99 mEq/L — ABNORMAL LOW (ref 100–111)
Creatinine: 0.7 mg/dL (ref 0.6–1.0)
Globulin: 3.3 g/dL (ref 2.0–3.6)
Glucose: 126 mg/dL — ABNORMAL HIGH (ref 70–100)
Potassium: 3 mEq/L — ABNORMAL LOW (ref 3.5–5.1)
Protein, Total: 7 g/dL (ref 6.0–8.3)
Sodium: 135 mEq/L — ABNORMAL LOW (ref 136–145)

## 2019-08-25 LAB — CBC AND DIFFERENTIAL
Absolute NRBC: 0 10*3/uL (ref 0.00–0.00)
Basophils Absolute Automated: 0.03 10*3/uL (ref 0.00–0.08)
Basophils Automated: 0.5 %
Eosinophils Absolute Automated: 0.01 10*3/uL (ref 0.00–0.44)
Eosinophils Automated: 0.2 %
Hematocrit: 39.4 % (ref 34.7–43.7)
Hgb: 13.2 g/dL (ref 11.4–14.8)
Immature Granulocytes Absolute: 0.03 10*3/uL (ref 0.00–0.07)
Immature Granulocytes: 0.5 %
Lymphocytes Absolute Automated: 1.2 10*3/uL (ref 0.42–3.22)
Lymphocytes Automated: 21.7 %
MCH: 32.5 pg (ref 25.1–33.5)
MCHC: 33.5 g/dL (ref 31.5–35.8)
MCV: 97 fL — ABNORMAL HIGH (ref 78.0–96.0)
MPV: 10.1 fL (ref 8.9–12.5)
Monocytes Absolute Automated: 0.4 10*3/uL (ref 0.21–0.85)
Monocytes: 7.2 %
Neutrophils Absolute: 3.86 10*3/uL (ref 1.10–6.33)
Neutrophils: 69.9 %
Nucleated RBC: 0 /100 WBC (ref 0.0–0.0)
Platelets: 89 10*3/uL — ABNORMAL LOW (ref 142–346)
RBC: 4.06 10*6/uL (ref 3.90–5.10)
RDW: 13 % (ref 11–15)
WBC: 5.53 10*3/uL (ref 3.10–9.50)

## 2019-08-25 LAB — URINALYSIS REFLEX TO MICROSCOPIC EXAM - REFLEX TO CULTURE
Bilirubin, UA: NEGATIVE
Glucose, UA: NEGATIVE
Ketones UA: 80
Leukocyte Esterase, UA: NEGATIVE
Nitrite, UA: NEGATIVE
Protein, UR: NEGATIVE
Specific Gravity UA: 1.013 (ref 1.001–1.035)
Urine pH: 6 (ref 5.0–8.0)
Urobilinogen, UA: NEGATIVE mg/dL (ref 0.2–2.0)

## 2019-08-25 LAB — RAPID DRUG SCREEN, URINE
Barbiturate Screen, UR: NEGATIVE
Barbiturate Screen, UR: NEGATIVE
Benzodiazepine Screen, UR: NEGATIVE
Benzodiazepine Screen, UR: NEGATIVE
Cannabinoid Screen, UR: POSITIVE — AB
Cannabinoid Screen, UR: POSITIVE — AB
Cocaine, UR: NEGATIVE
Cocaine, UR: NEGATIVE
Opiate Screen, UR: POSITIVE — AB
Opiate Screen, UR: POSITIVE — AB
PCP Screen, UR: NEGATIVE
PCP Screen, UR: NEGATIVE
Urine Amphetamine Screen: NEGATIVE
Urine Amphetamine Screen: NEGATIVE

## 2019-08-25 LAB — GFR: EGFR: >60

## 2019-08-25 LAB — COVID-19 (SARS-COV-2): SARS CoV 2 Overall Result: NOT DETECTED

## 2019-08-25 LAB — MAGNESIUM: Magnesium: 1.3 mg/dL — ABNORMAL LOW (ref 1.6–2.6)

## 2019-08-25 LAB — PHOSPHORUS: Phosphorus: 2.2 mg/dL — ABNORMAL LOW (ref 2.3–4.7)

## 2019-08-25 LAB — HEMOLYSIS INDEX: Hemolysis Index: 6 (ref 0–18)

## 2019-08-25 MED ORDER — PROMETHAZINE HCL 25 MG/ML IJ SOLN
12.50 mg | Freq: Four times a day (QID) | INTRAMUSCULAR | Status: DC | PRN
Start: 2019-08-25 — End: 2019-08-27
  Administered 2019-08-25 – 2019-08-26 (×3): 12.5 mg via INTRAVENOUS
  Filled 2019-08-25 (×3): qty 1

## 2019-08-25 MED ORDER — CHLORDIAZEPOXIDE HCL 25 MG PO CAPS
25.00 mg | ORAL_CAPSULE | Freq: Three times a day (TID) | ORAL | Status: DC
Start: 2019-08-25 — End: 2019-08-27
  Administered 2019-08-25 – 2019-08-27 (×5): 25 mg via ORAL
  Filled 2019-08-25 (×5): qty 1

## 2019-08-25 MED ORDER — SODIUM CHLORIDE 0.9 % IV SOLN
20.0000 mmol | Freq: Once | INTRAVENOUS | Status: AC
  Administered 2019-08-25: 15:00:00 20 mmol via INTRAVENOUS
  Filled 2019-08-25: qty 6.67

## 2019-08-25 MED ORDER — SODIUM CHLORIDE 0.9 % IV SOLN
INTRAVENOUS | Status: DC
Start: 2019-08-26 — End: 2019-08-25

## 2019-08-25 MED ORDER — HYDROMORPHONE HCL 1 MG/ML IJ SOLN
1.00 mg | INTRAMUSCULAR | Status: DC | PRN
Start: 2019-08-25 — End: 2019-08-27
  Administered 2019-08-25 – 2019-08-27 (×8): 1 mg via INTRAVENOUS
  Filled 2019-08-25 (×8): qty 1

## 2019-08-25 MED ORDER — LACTATED RINGERS IV SOLN
INTRAVENOUS | Status: DC
Start: 2019-08-25 — End: 2019-08-25

## 2019-08-25 MED ORDER — MAGNESIUM SULFATE IN D5W 1-5 GM/100ML-% IV SOLN
1.00 g | Freq: Once | INTRAVENOUS | Status: AC
Start: 2019-08-25 — End: 2019-08-25
  Administered 2019-08-25: 12:00:00 1 g via INTRAVENOUS
  Filled 2019-08-25: qty 100

## 2019-08-25 MED ORDER — FOLIC ACID 5 MG/ML IJ SOLN
Freq: Every day | INTRAMUSCULAR | Status: DC
Start: 2019-08-26 — End: 2019-08-26
  Filled 2019-08-25: qty 1000

## 2019-08-25 MED ORDER — CHLORDIAZEPOXIDE HCL 25 MG PO CAPS
25.00 mg | ORAL_CAPSULE | Freq: Three times a day (TID) | ORAL | Status: DC | PRN
Start: 2019-08-29 — End: 2019-08-27

## 2019-08-25 MED ORDER — CHLORDIAZEPOXIDE HCL 25 MG PO CAPS
25.00 mg | ORAL_CAPSULE | Freq: Two times a day (BID) | ORAL | Status: DC
Start: 2019-08-27 — End: 2019-08-27

## 2019-08-25 NOTE — Plan of Care (Signed)
Problem: Safety  Goal: Patient will be free from injury during hospitalization  Outcome: Progressing  Flowsheets (Taken 08/25/2019 0614)  Patient will be free from injury during hospitalization:   Hourly rounding   Assess patient's risk for falls and implement fall prevention plan of care per policy   Assess for patients risk for elopement and implement Elopement Risk Plan per policy     Problem: Altered GI Function  Goal: Fluid and electrolyte balance are achieved/maintained  Outcome: Progressing  Flowsheets (Taken 08/25/2019 0614)  Fluid and electrolyte balance are achieved/maintained:   Monitor intake and output every shift   Monitor/assess lab values and report abnormal values   Provide adequate hydration   Assess and reassess fluid and electrolyte status   Observe for cardiac arrhythmias   Monitor for muscle weakness  Note: Pt a&o, vss, c/o abdominal pain- Morphine 2mg  IV and Dilaudid 0.4mg  IV alternatively, CIWA score <11, Ativan 1mg , IVF hydration, was not able to tolerate,- N/V- Zofran 4mg  IV given with good effect, will continue to monitor GI status and report significant changes.

## 2019-08-25 NOTE — Plan of Care (Signed)
Problem: Altered GI Function  Goal: Fluid and electrolyte balance are achieved/maintained  Outcome: Progressing  Flowsheets (Taken 08/25/2019 0614 by Reatha Armour, Kidist A, RN)  Fluid and electrolyte balance are achieved/maintained:   Monitor intake and output every shift   Monitor/assess lab values and report abnormal values   Provide adequate hydration   Assess and reassess fluid and electrolyte status   Observe for cardiac arrhythmias   Monitor for muscle weakness     Problem: Pain  Goal: Pain at adequate level as identified by patient  Flowsheets (Taken 08/24/2019 2002 by Laurel Dimmer, RN)  Pain at adequate level as identified by patient:   Identify patient comfort function goal   Assess pain on admission, during daily assessment and/or before any "as needed" intervention(s)   Reassess pain within 30-60 minutes of any procedure/intervention, per Pain Assessment, Intervention, Reassessment (AIR) Cycle   Evaluate if patient comfort function goal is met   Evaluate patient's satisfaction with pain management progress   Offer non-pharmacological pain management interventions

## 2019-08-25 NOTE — UM Notes (Addendum)
[facility] Utilization Review  NPI: [id number], Tax ID: [id number]  Please Call Laury Axon @ [phone] with any questions or concerns. Confidential voicemail.  Email: [email or web address]   Fax final authorizations and requests for additional information to [phone]      08/24/19 1308    Place (admit) for Observation Services        08/25/19 1504    Admit to Inpatient Once   Admitting Physician ZAMAN, TARIQUEDiagnosis Alcohol withdrawal syndrome without complication     08/24/19 Medicine Assessment/Plan:  38 y.o. female with a PMHx of alcohol induced pancreatitis and alcoholism who presented with complaints of abdominal pain since yesterday morning as well as nausea and vomiting.  Patient stated that on Friday she developed nausea and vomiting.  Yesterday morning the patient developed epigastric and right upper quadrant pain, worsening, radiating to her back.  Patient stated that her symptoms are similar to the symptoms with prior episodes of acute pancreatitis.    She stated that for the past month she has been drinking about half a gallon of vodka daily.  Her last drink was yesterday morning when she drank 1 shot of vodka.Patient stated that she has a history of alcohol withdrawal and about a year ago she had a history of withdrawal induced seizures.    Patient Active Hospital Problem List:    1.  Alcohol induced acute pancreatitis with suspicious necrosis.  Imaging studies did not show gallstones.  We'll order NPO, IVF with NS. we'll order zofran for pain control and morphine and dilaudid for pain control.   Considering complicated nature of pancreatitis we'll consult GI.    2. Alcohol withdrawal syndrome without complication (08/24/2019).      Assessment and plan: We will admit to telemetry; we will initiate CIWA protocol with Lorazepam as needed.    Will order banana bag and thiamine daily    3.  Transaminase elevation.  Most likely alcohol induced.  Will monitor    4.  History of  hypertension.  Will reorder patient's home medications once verified.    5.  Abnormal urinalysis.  Patient denies having any symptoms related to urinary tract infection.  We will repeat urinalysis.    Nutrition  N.p.o.    DVT/VTE Prophylaxis  SCDs    CIWA score 8-11    08/24/19  97.7 153 96 % 20 137/98    08/25/19  98.0   92   100 % 18  151/113    08/24/2019   Platelet Count: 127     08/25/2019   Platelet Count: 89     08/25/2019   Sodium: 135   Potassium: 3.0     08/25/2019   BUN: 6.0     08/24/2019   Chloride: 96   CO2: 20   Calcium: 9.5  Anion Gap: 22.0     08/25/2019   Chloride: 99   CO2: 15   Calcium: 8.3   Anion Gap: 21.0     08/24/2019   AST: 232   ALT: 135   Alkaline Phosphatase: 210     08/25/2019   AST: 129   ALT: 94   Alkaline Phosphatase: 179       08/24/2019   Bilirubin Total: 2.4   Bilirubin Direct: 1.2   Lipase: 300     08/25/2019   Bilirubin Total: 1.7     08/24/2019   Color, UA: Amber   Clarity, UA: Sl Cloudy   Leukocyte Esterase, UA: Trace   Protein, UR: >=500  Bilirubin, UA: Small   Blood, UA: Large   RBC UA: 26 - 50   WBC, UA: 11 - 25   Squamous Epithelial Cells, Urine: 26 - 50   Hyaline Casts, UA: 11 - 25     08/24/19 CT abdomen:  IMPRESSION:    1. Acute pancreatitis with findings suspicious for pancreatic necrosis  as described above. Clinical correlation with pancreatic enzyme levels  is recommended.  2. Fatty infiltration of the liver and additional findings as described  Above.    08/24/19 US abdomen:  IMPRESSION:    No sonographically identifiable cholelithiasis or biliary  ductal dilatation.    Scheduled Meds:  Current Facility-Administered Medications   Medication Dose Route Frequency    famotidine  20 mg Oral Q12H SCH    Or    famotidine  20 mg Intravenous Q12H SCH    [START ON 08/27/2019] folic acid  1 mg Oral Daily    LORazepam  1 mg Intravenous Once    [START ON 08/27/2019] multivitamin  1 tablet Oral Daily    IV fluids with MVI, thiamine (VITAMIN B-1), folic acid   Intravenous Daily    [START ON  08/27/2019] thiamine  100 mg Oral Daily     Continuous Infusions:   sodium chloride 100 mL/hr at 08/25/19 0352     famotidine (PEPCID) injection 20 mg   Dose: 20 mg  Freq: Every 12 hours scheduled Route: IVx 4 doses    heparin (porcine) injection 5,000 Units   Dose: 5,000 Units  Freq: Every 8 hours scheduled Route: SC x 1 dose    LORazepam (ATIVAN) injection 1 mg   Dose: 1 mg  Freq: Once Route: IV x 5 doses    LORazepam (ATIVAN) injection 1-2 mg   Dose: 2 mg  Freq: Every 1 hour PRN Route: IV x 1dose    metoclopramide (REGLAN) injection 10 mg   Dose: 10 mg  Freq: Once Route: IV x 1 dose    morphine injection 2 mg   Dose: 2 mg  Freq: Once Route: IV x 3 doses    morphine injection 4 mg   Dose: 4 mg  Freq: Once Route: IV x2 doses    ondansetron (ZOFRAN) injection 4 mg   Dose: 4 mg  Freq: Once Route: IV x 4 doses    sodium chloride 0.9 % 1,000 mL with thiamine 100 mg, folic acid 1 mg, m.v.i. adult 10 mL infusion   Freq: Daily Route: IV x 2 doses    sodium chloride 0.9 % bolus 2,000 mL   Dose: 2,000 mL  Freq: Once Route: IV x 1 dose    0.9% NaCl infusion   Rate: 100 mL/hr Freq: Continuous Route: IV    HYDROmorphone (DILAUDID) injection 0.4 mg   Dose: 0.4 mg  Freq: Every 4 hours PRN Route: IV x 1 dose    oxyCODONE (ROXICODONE) immediate release tablet 5 mg   Dose: 5 mg  Freq: Every 4 hours PRN Route: PO x 1 dose

## 2019-08-25 NOTE — Consults (Signed)
CONSULTATION NOTE    [address], [place], [place] [address]  [phone]  Philis Kendall [id number]    Date of admission: 08/24/2019  Date of consult: 08/25/19      Assessment:  Active Hospital Problems    Diagnosis    Nausea vomiting and diarrhea    Hypokalemia    Hypophosphatemia    Hypomagnesemia    Thrombocytopenia    High anion gap metabolic acidosis    Marijuana abuse    Alcohol abuse    History of withdrawal seizures    Acute alcoholic hepatitis    Hyperbilirubinemia    Acute pancreatitis    Alcohol withdrawal syndrome without complication       Cassandra Gay is a 38 y.o. female   ___________________________  Plan:  1.  Continue with conservative management.  2.  No need for antibiotics  3.  Elevated transaminases also due to alcoholic steatohepatitis which should improve once she stops drinking.   4.  Lipid panel should be checked as outpatient after acute flare up since it wouldn't be accurate now.   5.  Will follow with you    We discussed importance of complete abstinence from alcohol and she seems motivated to quit.     Candis Musa, MD   3:59 PM    Thank you for allowing [place] to see and participate in this patient's care.  ________________________    Reason for consultation: Pancreatitis    Chief complaint: Abd pain    HPI:  Cassandra Gay is a 38 y.o. female with h/o ETOH pancreatitis with about 7 prior episodes who present again with another episode found to have CT evidence of acute pancreatitis with some necrosis as described below.  This episode started after drinking with the onset of recurrent N/V and severe upper abd pain radiating to her back.  She recently got back from [place] where her and her husband were visiting family member who is ill.  She is very tearful about her current situation given she has been in rehab before and was able to quit drinking.  No report of hematemesis/melena/or other complications.     Past Medical History:   Diagnosis Date    Hypertension         History reviewed. No pertinent surgical history.    No Known Allergies    Social History     Socioeconomic History    Marital status: Single     Spouse name: Not on file    Number of children: Not on file    Years of education: Not on file    Highest education level: Not on file   Occupational History    Not on file   Social Needs    Financial resource strain: Not on file    Food insecurity     Worry: Not on file     Inability: Not on file    Transportation needs     Medical: Not on file     Non-medical: Not on file   Tobacco Use    Smoking status: Never Smoker    Smokeless tobacco: Never Used   Substance and Sexual Activity    Alcohol use: Not on file    Drug use: Not on file    Sexual activity: Not on file   Lifestyle    Physical activity     Days per week: Not on file     Minutes per session: Not on file    Stress: Not  on file   Relationships    Social connections     Talks on phone: Not on file     Gets together: Not on file     Attends religious service: Not on file     Active member of club or organization: Not on file     Attends meetings of clubs or organizations: Not on file     Relationship status: Not on file    Intimate partner violence     Fear of current or ex partner: Not on file     Emotionally abused: Not on file     Physically abused: Not on file     Forced sexual activity: Not on file   Other Topics Concern    Not on file   Social History Narrative    Not on file       History reviewed. No pertinent family history.    Current Discharge Medication List      CONTINUE these medications which have NOT CHANGED    Details   lisinopril (ZESTRIL) 40 MG tablet Take 40 mg by mouth daily Last time patient filled medication was on 12/2017 - 30 day supply per pharmacist             Current Facility-Administered Medications   Medication Dose Route Frequency Last Rate Last Dose    acetaminophen (TYLENOL) tablet 650 mg  650 mg Oral Q6H PRN        famotidine (PEPCID) tablet 20 mg  20 mg  Oral Q12H SCH        Or    famotidine (PEPCID) injection 20 mg  20 mg Intravenous Q12H SCH   20 mg at 08/25/19 0913    [START ON 08/27/2019] folic acid (FOLVITE) tablet 1 mg  1 mg Oral Daily        HYDROmorphone (DILAUDID) injection 0.4 mg  0.4 mg Intravenous Q4H PRN   0.4 mg at 08/25/19 1126    lactated ringers infusion   Intravenous Continuous 100 mL/hr at 08/25/19 1204      LORazepam (ATIVAN) injection 1 mg  1 mg Intravenous Once        LORazepam (ATIVAN) injection 1-2 mg  1-2 mg Intravenous Q1H PRN   1 mg at 08/25/19 1203    [START ON 08/26/2019] m.v.i. adult 10 mL, thiamine (B-1) 100 mg in sodium chloride 0.9 % 1,000 mL infusion   Intravenous Continuous        morphine injection 2 mg  2 mg Intravenous Q4H PRN   2 mg at 08/25/19 1502    [START ON 08/27/2019] multivitamin tablet 1 tablet  1 tablet Oral Daily        naloxone (NARCAN) injection 0.2 mg  0.2 mg Intravenous PRN        ondansetron (ZOFRAN-ODT) disintegrating tablet 4 mg  4 mg Oral Q6H PRN        Or    ondansetron (ZOFRAN) injection 4 mg  4 mg Intravenous Q6H PRN   4 mg at 08/25/19 0713    promethazine (PHENERGAN) 12.5 mg in sodium chloride 0.9 % 50 mL IVPB  12.5 mg Intravenous Q6H PRN        [START ON 08/27/2019] thiamine (VITAMIN B1) tablet 100 mg  100 mg Oral Daily           Review of Systems  Constitutional  Negative for fevers or weight loss   Skin  Negative for rash   HENT  Negative for sore throat  Eyes  Negative for blurred vision   Cardiovascular  Negative for chest pain   Respiratory  Negative for SOB or cough   Gastrointestinal  per HPI   Genitourinary  Negative for dysuria, hematuria, or frequency   Musculoskeletal  Negative for joint pain   Endo  Negative for diabetes   Heme  Negative for anemia   Neurological  Negative for syncope   Psych  Negative for depression       Physical Exam  BP (!) 159/112    Pulse 91    Temp 98.2 F (36.8 C) (Oral)    Resp 18    Ht 1.753 m (5\' 9" )    Wt 85.8 kg (189 lb 1.6 oz)    LMP 08/13/2019    SpO2  97%    BMI 27.93 kg/m     General Appearance:    no acute distress, appears stated age and looks comfortable   HEENT:    Normocephalic, without obvious abnormality, atraumatic, sclera anicteric, oral mucosa pink/moist   Abdomen:    S/TTP across entire upper abd with gaurding.   Rectal:   deferred    Extremities:   Extremities normal, atraumatic, no cyanosis or edema   Skin:   Skin color, texture, turgor normal, no rashes or lesions and no jaundice   Neurologic:   AAOx3, no focal deficits           Psychological: normal affect    Laboratory Data reviewed:    Recent Labs   Lab 08/25/19  0404 08/24/19  0836   WBC 5.53 5.56   Hgb 13.2 14.2   Hematocrit 39.4 42.0   Platelets 89* 127*   MCV 97.0* 96.8*   Neutrophils 69.9 72.2     Recent Labs   Lab 08/25/19  0404 08/24/19  0836   Sodium 135* 138   Potassium 3.0* 3.6   Chloride 99* 96*   CO2 15* 20*   BUN 6.0* 11.0   Creatinine 0.7 0.9   Glucose 126* 172*   Calcium 8.3* 9.5   Magnesium 1.3*  --    Phosphorus 2.2*  --    Protein, Total 7.0 8.0   Albumin 3.7 4.3   AST (SGOT) 129* 232*   ALT 94* 135*   Alkaline Phosphatase 179* 210*   Bilirubin, Total 1.7* 2.4*     Glucose:    Recent Labs   Lab 08/25/19  0404 08/24/19  0836   Glucose 126* 172*           Radiological Imaging reviewed:  CT 08/24/19:  LUNG BASES: There is a mild amount of atelectasis and scarring in the  included lung bases.      ABDOMEN: The gallbladder appears distended but otherwise grossly  unremarkable. There is diffuse fatty infiltration of the liver. The  liver, spleen, adrenal glands, and kidneys otherwise appear  unremarkable. There is nonspecific stranding in the peripancreatic fat  suspicious for pancreatitis. There is a markedly atrophic appearance of  the pancreatic tail. There are heterogeneous areas of hypoenhancement  involving cranial portions of the pancreatic body and neck suspicious  for pancreatic necrosis. There is no evidence for well-defined  peripancreatic fluid collection. There is no  free fluid or free  intraperitoneal air in the abdomen. There are multiple subcentimeter  upper abdominal lymph nodes.    PELVIS: There is no evidence for free pelvic fluid. The urinary bladder  appears grossly unremarkable for degree of distention. The uterus and  adnexa appear grossly unremarkable  within the limitations of technique.  Evaluation of the small bowel and colon is limited given lack of oral  contrast opacification. The small bowel and colon appear grossly  unremarkable within the limitations of lack of oral contrast  opacification. The visualized portions of the appendix appear within  normal limits. Bone windows demonstrate degenerative changes in the  spine and pelvis. There is associated irregularity of the left  sacroiliac joint with asymmetric widening and subchondral sclerosis  suspicious for inflammatory arthropathy.

## 2019-08-25 NOTE — Progress Notes (Signed)
Nutritional Support Services  Nutrition Assessment    Cassandra Gay 38 y.o. female   MRN: [id number]    Summary of Nutrition Recommendations:  1. When medically feasible, recommend advance diet as tolerated to cardiac   2. Monitor PO acceptance as diet advances/need for nutritional supplements   3. Monitor electrolytes/replete PRN     ---------------------------------------------------------------------------------------------------------------                                                      Assessment Data:   Referral Source: RN Screen   Reason for Referral: wt loss/decreased appetite     Nutrition: Pt is on isolation precautions for COVID rule-out. Pt did not answer telephone. Pt currently NPO.     Learning Needs: Not able to be addressed     Hospital Admission: Pt presented with complaints of abdominal pain, n/v, diarrhea. Pt relapsed into alcohol abuse about 1 month ago- drinking 1/2 gallon of vodka daily.  Pt diagnosed with alcohol induced acute pancreatitis with suspicious necrosis.     Medical Hx:  has a past medical history of Hypertension.    PSH: has no past surgical history on file.     Orders Placed This Encounter   Procedures   . Diet NPO effective now Except for: ICE CHIPS, SIPS WITH MEDS     Intake:  NPO     ANTHROPOMETRIC  Height: 175.3 cm (5\' 9" )  Weight: 85.7 kg (188 lb 14.4 oz)     Weight Change: 2.66  IBW/kg (Calculated) Female: 72.75 kg  IBW/kg (Calculated) Female: 65.89 kg  Body mass index is 27.9 kg/m.     Weight Monitoring 08/24/2019 08/24/2019   Height 175.3 cm 175.3 cm   Height Method - Stated   Weight 83.462 kg 85.684 kg   Weight Method - Standing Scale   BMI (calculated) 27.2 kg/m2 28 kg/m2     Weight History Summary: Unable to obtain, however per RN screen pt lost 34# or more in 3 months.     Physical Assessment:   Edema: no edema per RN flowsheet   Skin: abrasion, bruising per RN flowsheet   GI function: LBM 9/5, nausea/vomiting per RN flowsheet     ESTIMATED NEEDS    Total Daily  Energy Needs: 1714 to 2142.5 kcal  Method for Calculating Energy Needs: 20 kcal - 25 kcal per kg  at 85.7 kg (Actual body weight)  Rationale: overweight BMI, non-critically ill        Total Daily Protein Needs: 94.27 to 111.41 g  Method for Calculating Protein Needs: 1.1 g - 1.3 g per kg at 85.7 kg (Actual body weight)  Rationale: overweight BMI, non-critically ill      Total Daily Fluid Needs: 1714 to 2142.5 ml  Method for Calculating Fluid Needs: 1 ml per kcal energy = 1714 to 2142.5 kcal  Rationale: or per MD recommendations     Pertinent Medications: famotidine, sodium chloride, folic acid, MVI, thiamine   IVF:    . sodium chloride 100 mL/hr at 08/25/19 0352     Pertinent labs: Na 135, K+ 3.0, Mg 1.3, Phos 2.2  Nutrition Diagnosis      Inadequate oral intake related to alcohol induced pancreatitis as evidenced by NPO status.                                                               Intervention     Nutrition recommendation - Please refer to top of note                                                               Monitoring/Evaluation     Goals: Tolerate diet advancement within 48-72 hrs (new)       Nutrition Risk Level: High (will follow up at least 2 times per week and PRN)     Concepcion Living, MS RDN   [phone]

## 2019-08-25 NOTE — Progress Notes (Signed)
HOSPITALIST PROGRESS NOTE      Patient: Cassandra Gay  Date: 08/25/2019   LOS: 0 Days  Admission Date: 08/24/2019   MRN: [id number]  Attending: Teodora Medici  MD, MPH                    Please contact via Secure Chat                    Spectralink: [phone]                    Pager: [phone]     ASSESSMENT/PLAN     [name] is a 38 y.o. female admitted with Alcohol withdrawal syndrome without complication    Interval Summary:     Active Hospital Problems    Diagnosis    Nausea vomiting and diarrhea    Hypokalemia    Hypophosphatemia    Hypomagnesemia    Thrombocytopenia    High anion gap metabolic acidosis    Marijuana abuse    Alcohol abuse    History of withdrawal seizures    Acute alcoholic hepatitis    Hyperbilirubinemia    Acute pancreatitis    Alcohol withdrawal syndrome without complication       Patient Active Hospital Problem List:   Acute pancreatitis (08/25/2019)    Assessment: Suspect alcoholic pancreatitis; CTA shows evidence of necrosis  Plan: Continue n.p.o. status  Dose of Dilaudid increased from 0.4 to 1 mg every 4 hour  Continue banana bag at 125 cc  If evidence of fever/sepsis noted, will recommend starting broad-spectrum antibiotic coverage with Z[facility] GI will see the patient tomorrow       Acute alcoholic hepatitis (08/25/2019)   Thrombocytopenia (08/25/2019)   Hyperbilirubinemia (08/25/2019)    Assessment: Liver enzymes are showing improving trends; drop in platelets noted    Plan: daily CBC and chemistry     Alcohol withdrawal syndrome without complication (08/24/2019)   History of withdrawal seizures (08/25/2019)   Alcohol abuse (08/25/2019)    Assessment: Current heavy use of alcohol    Plan:  further discussions once patient is symptomatically better  Continue CIWA  Started Librium taper 25 mg every 8 hours for 48 hours 25 mg every 12 hours for 48 hours and then as needed     Nausea vomiting and diarrhea (08/25/2019)   Hypokalemia (08/25/2019)   Hypophosphatemia (08/25/2019)   Hypomagnesemia  (08/25/2019)   High anion gap metabolic acidosis (08/25/2019)    Assessment: Secondary to depletion from the above    Plan: Potassium phosphate 20 mEq IV x 1  Magnesium sulfate 1 g IV x1  Recommend continuing continuing banana bag     Marijuana abuse (08/25/2019)    Assessment: Current and active but U tox positive    Plan: Abstinence counseling to be done once symptomatically stable    Analgesia: Dilaudid    Nutrition: Maintain n.p.o. status    GI Prophylaxis: Continue Pepcid IV/p.o.    DVT Prophylaxis: Lovenox    Code Status: Full code    DISPO: Anticipate 3 to 5 days       SUBJECTIVE     Cassandra Gay states that her pain is not well controlled.  She is requesting that she be started on Phenergan for nausea as Zofran is not helping much.  She declines any food/drinks.     MEDICATIONS     Current Facility-Administered Medications   Medication Dose Route Frequency    famotidine  20  mg Oral Q12H [place]    Or    famotidine  20 mg Intravenous Q12H [facility]    [START ON 08/27/2019] folic acid  1 mg Oral Daily    LORazepam  1 mg Intravenous Once    [START ON 08/27/2019] multivitamin  1 tablet Oral Daily    IV fluids with MVI, thiamine (VITAMIN B-1), folic acid   Intravenous Daily    [START ON 08/27/2019] thiamine  100 mg Oral Daily       PHYSICAL EXAM     Vitals:    08/25/19 0700   BP: (!) 151/113   Pulse: 92   Resp: 18   Temp: 98 F (36.7 C)   SpO2: 100%       Temperature: Temp  Min: 97.9 F (36.6 C)  Max: 98.3 F (36.8 C)  Pulse: Pulse  Min: 92  Max: 118  Respiratory: Resp  Min: 16  Max: 20  Non-Invasive BP: BP  Min: 126/97  Max: 151/113  Pulse Oximetry SpO2  Min: 96 %  Max: 100 %    Intake and Output Summary (Last 24 hours) at Date Time    Intake/Output Summary (Last 24 hours) at 08/25/2019 1119  Last data filed at 08/25/2019 0700  Gross per 24 hour   Intake 1946.67 ml   Output 600 ml   Net 1346.67 ml       Physical Exam   Constitutional: She is oriented to person, place, and time. She appears distressed.   Patient is  moderately distressed on account of persistent nausea and abdominal pain   HENT:   Head: Normocephalic and atraumatic.   Right Ear: External ear normal.   Left Ear: External ear normal.   Mouth/Throat: Oropharynx is clear and moist.   Eyes: Pupils are equal, round, and reactive to light.   Neck: Normal range of motion. Neck supple. No JVD present. No tracheal deviation present. No thyromegaly present.   Cardiovascular: Normal rate, regular rhythm, normal heart sounds and intact distal pulses. Exam reveals no gallop and no friction rub.   No murmur heard.  Pulmonary/Chest: Effort normal and breath sounds normal. No stridor. No respiratory distress. She has no wheezes. She has no rales. She exhibits no tenderness.   Abdominal: Soft. Bowel sounds are normal. She exhibits no distension and no mass. There is abdominal tenderness (Diffusely). There is no rebound and no guarding.   Musculoskeletal: Normal range of motion.         General: No tenderness, deformity or edema.   Lymphadenopathy:     She has no cervical adenopathy.   Neurological: She is alert and oriented to person, place, and time. She has normal reflexes. No cranial nerve deficit. She exhibits normal muscle tone. Gait normal. Coordination normal. GCS score is 15.   Skin: Skin is warm and dry. No rash noted. She is not diaphoretic. No erythema. No pallor.   Psychiatric: Mood, memory, affect and judgment normal.           LABS     Recent Labs   Lab 08/25/19  0404 08/24/19  0836   WBC 5.53 5.56   RBC 4.06 4.34   Hgb 13.2 14.2   Hematocrit 39.4 42.0   MCV 97.0* 96.8*   Platelets 89* 127*       Recent Labs   Lab 08/25/19  0404 08/24/19  0836   Sodium 135* 138   Potassium 3.0* 3.6   Chloride 99* 96*   CO2 15* 20*  BUN 6.0* 11.0   Creatinine 0.7 0.9   Glucose 126* 172*   Calcium 8.3* 9.5   Magnesium 1.3*  --        Recent Labs   Lab 08/25/19  0404 08/24/19  0836   ALT 94* 135*   AST (SGOT) 129* 232*   Bilirubin, Total 1.7* 2.4*   Bilirubin Direct  --  1.2*    Albumin 3.7 4.3   Alkaline Phosphatase 179* 210*       Recent Labs   Lab 08/24/19  0836   Troponin I <0.01             Microbiology Results     Procedure Component Value Units Date/Time    COVID-19 (SARS-COV-2) ([place] Standard test) [[id number]] Collected:  08/24/19 1732    Specimen:  Nasopharyngeal Swab from Nasopharynx Updated:  08/24/19 2119     Purpose of COVID testing Diagnostic -PUI     SARS-CoV-2 Specimen Source Nasopharyngeal    Narrative:       o Collect and clearly label specimen type:  o PREFERRED-Upper respiratory specimen: One Nasopharyngeal  Swab in Transport Media.  o Hand deliver to laboratory ASAP    Urine culture [[id number]] Collected:  08/24/19 0817    Specimen:  Bladder Urine Updated:  08/24/19 7829    Narrative:       Replace urinary catheter prior to obtaining the urine culture  if it has been in place for greater than or equal to 14  days:->N/A No Foley  Indications for U/A Reflex to Micro - Reflex to  Culture:->Other (please specify in Comments)           Ct Abd/pelvis With Iv Contrast    Result Date: 08/24/2019   1. Acute pancreatitis with findings suspicious for pancreatic necrosis as described above. Clinical correlation with pancreatic enzyme levels is recommended. 2. Fatty infiltration of the liver and additional findings as described above. Sandie Ano, MD  08/24/2019 10:20 AM    US Abdomen Limited Ruq (after Ct Abd)    Result Date: 08/24/2019   No sonographically identifiable cholelithiasis or biliary ductal dilatation. Laurena Slimmer, MD  08/24/2019 12:10 PM      Echo Results     None          I have personally reviewed the patient's labs, medications, and imaging    Total visit time = 45 mins ; more than 50% spent counseling/coordinating care    Signed,    Teodora Medici MD, MPH  11:19 AM 08/25/2019   Please contact via Secure Chat   SpectraLink: [phone]  Pager: [phone]       This chart was generated using a medical voice-recognition software which does not employ spell-checking or grammar-checking  features. It was dictated, all or in part, in a busy and often noisy patient care environment. I have taken all usual measures to dictate carefully and to review all aspects of this chart. Nonetheless, given the known and well-documented performance characteristics of voice recognition software in such patient care environments, this dictation still may contain unrecognized and wholly unintended errors or omissions.

## 2019-08-25 NOTE — Progress Notes (Signed)
Situation Pt presented with complaints of abdominal pain since yesterday morning as well as nausea and vomiting.      Background   Pt is a 38 y.o. female with a PMHx of alcohol induced pancreatitis and alcoholism    Assessment   CM spoke to the Pt and she denied any HH or SNF.  Pt uses no DME to ambulate.  Pt does have a DPOA and it is not on file here at the hospital.  Pt states she is independent w/ALL ADLs.  Pt states that she suffers with anxiety. Pt is a COVID r/o and no O2.  Psych consult placed.  CM sent charity application e-mail to financial specialist. Pt will need a TCM order placed upon discharge.    Recommendation   DCP: home w/nn transport family        08/25/19 1453   Patient Type   Within 30 Days of Previous Admission? No   Healthcare Decisions   Interviewed: Patient   Orientation/Decision Making Abilities of Patient Alert and Oriented x3, able to make decisions   Advance Directive Patient has advance directive, copy not in chart   Healthcare Agent Appointed No   Prior to admission   Prior level of function Independent with ADLs;Ambulates independently   Type of Residence Private residence   Have running water, electricity, heat, etc? Yes   Living Arrangements Spouse/significant other   How do you get to your MD appointments? Pt   How do you get your groceries? Pt   Who fixes your meals? Pt   Who does your laundry? Pt   Who picks up your prescriptions? Pt   Dressing Independent   Grooming Independent   Feeding Independent   Bathing Independent   Toileting Independent   Discharge Planning   Support Systems Spouse/significant other;Family members   Patient expects to be discharged to: home   Anticipated [place] plan discussed with: Patient   Mode of transportation: Private car (family member)   Does the patient have perscription coverage? No   Consults/Providers   PT Evaluation Needed 2   OT Evalulation Needed 2   SLP Evaluation Needed 2   Correct PCP listed in Epic? No (comment)   Family and PCP   In  case you are admitted, would like family notified? No   In case you are admitted, would like your PCP notified? No   PCP on file was verified as the current PCP? Patient/family states they do not have a PCP   Important Message from Medicare Notice   Patient received 1st IMM Letter? n/a     Laretta Bolster, LMSW  SW/Case Manager, [place]  [phone]

## 2019-08-26 LAB — COMPREHENSIVE METABOLIC PANEL
ALT: 66 U/L — ABNORMAL HIGH (ref 0–55)
AST (SGOT): 72 U/L — ABNORMAL HIGH (ref 5–34)
Albumin/Globulin Ratio: 1 (ref 0.9–2.2)
Albumin: 3.4 g/dL — ABNORMAL LOW (ref 3.5–5.0)
Alkaline Phosphatase: 148 U/L — ABNORMAL HIGH (ref 37–106)
Anion Gap: 17 — ABNORMAL HIGH (ref 5.0–15.0)
BUN: 3 mg/dL — ABNORMAL LOW (ref 7.0–19.0)
Bilirubin, Total: 1.7 mg/dL — ABNORMAL HIGH (ref 0.2–1.2)
CO2: 19 mEq/L — ABNORMAL LOW (ref 22–29)
Calcium: 8.5 mg/dL (ref 8.5–10.5)
Chloride: 97 mEq/L — ABNORMAL LOW (ref 100–111)
Creatinine: 0.7 mg/dL (ref 0.6–1.0)
Globulin: 3.3 g/dL (ref 2.0–3.6)
Glucose: 101 mg/dL — ABNORMAL HIGH (ref 70–100)
Potassium: 3.1 mEq/L — ABNORMAL LOW (ref 3.5–5.1)
Protein, Total: 6.7 g/dL (ref 6.0–8.3)
Sodium: 133 mEq/L — ABNORMAL LOW (ref 136–145)

## 2019-08-26 LAB — PT/INR
PT INR: 0.9 (ref 0.9–1.1)
PT: 12.3 s — ABNORMAL LOW (ref 12.6–15.0)

## 2019-08-26 LAB — CBC
Absolute NRBC: 0 10*3/uL (ref 0.00–0.00)
Hematocrit: 38.6 % (ref 34.7–43.7)
Hgb: 13.4 g/dL (ref 11.4–14.8)
MCH: 33.1 pg (ref 25.1–33.5)
MCHC: 34.7 g/dL (ref 31.5–35.8)
MCV: 95.3 fL (ref 78.0–96.0)
MPV: 9.9 fL (ref 8.9–12.5)
Nucleated RBC: 0 /100 WBC (ref 0.0–0.0)
Platelets: 96 10*3/uL — ABNORMAL LOW (ref 142–346)
RBC: 4.05 10*6/uL (ref 3.90–5.10)
RDW: 13 % (ref 11–15)
WBC: 6.15 10*3/uL (ref 3.10–9.50)

## 2019-08-26 LAB — PHOSPHORUS: Phosphorus: 3.1 mg/dL (ref 2.3–4.7)

## 2019-08-26 LAB — MAGNESIUM: Magnesium: 1.4 mg/dL — ABNORMAL LOW (ref 1.6–2.6)

## 2019-08-26 LAB — GFR: EGFR: >60

## 2019-08-26 LAB — HEMOLYSIS INDEX: Hemolysis Index: 12 (ref 0–18)

## 2019-08-26 MED ORDER — POTASSIUM CHLORIDE CRYS ER 20 MEQ PO TBCR
40.0000 meq | EXTENDED_RELEASE_TABLET | ORAL | Status: AC
Start: 2019-08-26 — End: 2019-08-26
  Administered 2019-08-26 (×2): 40 meq via ORAL
  Filled 2019-08-26 (×2): qty 2

## 2019-08-26 MED ORDER — LACTATED RINGERS IV SOLN
INTRAVENOUS | Status: DC
Start: 2019-08-26 — End: 2019-08-26

## 2019-08-26 MED ORDER — POLYETHYLENE GLYCOL 3350 17 G PO PACK
17.00 g | PACK | Freq: Every day | ORAL | Status: DC | PRN
Start: 2019-08-26 — End: 2019-08-27

## 2019-08-26 MED ORDER — ACETAMINOPHEN 325 MG PO TABS
325.0000 mg | ORAL_TABLET | Freq: Four times a day (QID) | ORAL | Status: DC | PRN
Start: 2019-08-26 — End: 2019-08-27

## 2019-08-26 MED ORDER — THIAMINE HCL 100 MG/ML IJ SOLN
100.00 mg | INTRAMUSCULAR | Status: DC
Start: 2019-08-27 — End: 2019-08-27
  Filled 2019-08-26: qty 1

## 2019-08-26 MED ORDER — SODIUM CHLORIDE 0.9 % IV SOLN
Freq: Every day | INTRAVENOUS | Status: DC
Start: 2019-08-26 — End: 2019-08-26
  Filled 2019-08-26: qty 1000

## 2019-08-26 MED ORDER — HEPARIN SODIUM (PORCINE) 5000 UNIT/ML IJ SOLN
5000.00 [IU] | Freq: Three times a day (TID) | INTRAMUSCULAR | Status: DC
Start: 2019-08-26 — End: 2019-08-27
  Administered 2019-08-26 – 2019-08-27 (×2): 5000 [IU] via SUBCUTANEOUS
  Filled 2019-08-26 (×2): qty 1

## 2019-08-26 MED ORDER — FOLIC ACID 1 MG PO TABS
1.0000 mg | ORAL_TABLET | Freq: Every day | ORAL | Status: DC
Start: 2019-08-27 — End: 2019-08-27

## 2019-08-26 MED ORDER — TAB-A-VITE/BETA CAROTENE PO TABS
1.0000 | ORAL_TABLET | Freq: Every day | ORAL | Status: DC
Start: 2019-08-27 — End: 2019-08-27

## 2019-08-26 MED ORDER — OXYCODONE HCL 5 MG PO TABS
5.0000 mg | ORAL_TABLET | Freq: Four times a day (QID) | ORAL | Status: DC | PRN
Start: 2019-08-26 — End: 2019-08-27
  Administered 2019-08-26 – 2019-08-27 (×3): 5 mg via ORAL
  Filled 2019-08-26 (×3): qty 1

## 2019-08-26 MED ORDER — MAGNESIUM SULFATE IN D5W 1-5 GM/100ML-% IV SOLN
1.0000 g | INTRAVENOUS | Status: AC
Start: 2019-08-26 — End: 2019-08-26
  Administered 2019-08-26 (×2): 1 g via INTRAVENOUS
  Filled 2019-08-26 (×2): qty 100

## 2019-08-26 MED ORDER — LACTATED RINGERS IV SOLN
INTRAVENOUS | Status: DC
Start: 2019-08-26 — End: 2019-08-27

## 2019-08-26 MED ORDER — SENNOSIDES-DOCUSATE SODIUM 8.6-50 MG PO TABS
2.0000 | ORAL_TABLET | Freq: Every evening | ORAL | Status: DC
Start: 2019-08-26 — End: 2019-08-27
  Administered 2019-08-26: 21:00:00 2 via ORAL
  Filled 2019-08-26: qty 2

## 2019-08-26 NOTE — Progress Notes (Addendum)
HOSPITALIST PROGRESS NOTE      Patient: Cassandra Gay  Date: 08/26/2019   LOS: 1 Days  Admission Date: 08/24/2019   MRN: [id number]  Attending: Dallie Piles                       Please contact via Secure Chat        ASSESSMENT/PLAN     Cassandra Gay is a 38 y.o. female admitted with Alcohol withdrawal syndrome without complication    Interval Summary:     Active Hospital Problems    Diagnosis    Nausea vomiting and diarrhea    Hypokalemia    Hypophosphatemia    Hypomagnesemia    Thrombocytopenia    High anion gap metabolic acidosis    Marijuana abuse    Alcohol abuse    History of withdrawal seizures    Acute alcoholic hepatitis    Hyperbilirubinemia    Acute pancreatitis    Alcohol withdrawal syndrome without complication       Patient Active Hospital Problem List:   Acute alcoholic pancreatitis    CTA shows evidence of necrosis. GI following, holding on abx. Continue NPO with sips. Possibly advance to clears tomorrow. Continue prn iv dilaudid and add prn oxycodone.   Change IVFs to LR at 17ml/hr. Monitor electrolytes and replete as needed. as needed antiemetics.         Acute alcoholic hepatitis (08/25/2019)   Thrombocytopenia (08/25/2019)   Hyperbilirubinemia (08/25/2019)  -Liver enzymes are showing improving trends; drop in platelets noted, improved.   daily CBC and chemistry     Alcohol withdrawal syndrome without complication (08/24/2019)   History of withdrawal seizures (08/25/2019)   Alcohol abuse (08/25/2019)  Current heavy use of alcohol. further discussions once patient is symptomatically better  Continue prn ativan per CIWA, scheduled librium       Nausea vomiting and diarrhea (08/25/2019)   Hypokalemia (08/25/2019)   Hypophosphatemia (08/25/2019)   Hypomagnesemia (08/25/2019)   High anion gap metabolic acidosis (08/25/2019)  Secondary to depletion from the above. Monitor and replete as needed       Marijuana abuse (08/25/2019)  Current and active but U tox positive. Cessation advised          GI  Prophylaxis: Continue Pepcid IV/p.o.    DVT Prophylaxis: heparin     Code Status: Full code    DISPO: Anticipate 3 to 5 days       SUBJECTIVE   Patient seen and examined at bedside.  Reports still having significant abdominal pain.   Will try sips as tolerated.Updated on plan of care. denies chest pain, shortness of breath    MEDICATIONS     Current Facility-Administered Medications   Medication Dose Route Frequency    chlordiazePOXIDE  25 mg Oral Q8H    Followed by    [START ON 08/27/2019] chlordiazePOXIDE  25 mg Oral Q12H    famotidine  20 mg Oral Q12H [facility]    Or    famotidine  20 mg Intravenous Q12H [facility]    LORazepam  1 mg Intravenous Once    senna-docusate  2 tablet Oral QHS    IV fluids with MVI, thiamine (VITAMIN B-1), folic acid   Intravenous Daily       PHYSICAL EXAM     Vitals:    08/26/19 1853   BP: 114/77   Pulse: (!) 101   Resp: 18   Temp: 98.4 F (36.9 C)   SpO2: 97%  Temperature: Temp  Min: 97.7 F (36.5 C)  Max: 98.4 F (36.9 C)  Pulse: Pulse  Min: 89  Max: 124  Respiratory: Resp  Min: 15  Max: 20  Non-Invasive BP: BP  Min: 114/77  Max: 150/101  Pulse Oximetry SpO2  Min: 94 %  Max: 100 %    Intake and Output Summary (Last 24 hours) at Date Time    Intake/Output Summary (Last 24 hours) at 08/26/2019 1903  Last data filed at 08/26/2019 1422  Gross per 24 hour   Intake 290 ml   Output 0 ml   Net 290 ml       PHYSICAL EXAMINATION  GEN APPEARANCE: appears uncomfortable but in NAD. Alert and cooperative.   HEENT: NCAT, EOMI, PERRL, no nasal discharge.   conjunctivae/corneas clear. Oral mucosa moist.   Neck: Supple without meningismus   CVS: RRR, S1, S2. No murmur   LUNGS: CTAB; No wheezes, crackles, rhonchi or rales  ABD: BS+, soft, diffuse ttp.  ND, no guarding or rigidity  EXT: No edema; Pulses 2+ and intact  SKIN: skin is warm and dry. No rash noted.  NEURO: AAOx3, CN 2-12 intact.  No focal neurological deficits  MENTAL STATUS: appropriate affect and mood            LABS     Recent Labs   Lab  08/26/19  0622 08/25/19  0404 08/24/19  0836   WBC 6.15 5.53 5.56   RBC 4.05 4.06 4.34   Hgb 13.4 13.2 14.2   Hematocrit 38.6 39.4 42.0   MCV 95.3 97.0* 96.8*   Platelets 96* 89* 127*       Recent Labs   Lab 08/26/19  0622 08/25/19  0404 08/24/19  0836   Sodium 133* 135* 138   Potassium 3.1* 3.0* 3.6   Chloride 97* 99* 96*   CO2 19* 15* 20*   BUN 3.0* 6.0* 11.0   Creatinine 0.7 0.7 0.9   Glucose 101* 126* 172*   Calcium 8.5 8.3* 9.5   Magnesium 1.4* 1.3*  --        Recent Labs   Lab 08/26/19  0622 08/25/19  0404 08/24/19  0836   ALT 66* 94* 135*   AST (SGOT) 72* 129* 232*   Bilirubin, Total 1.7* 1.7* 2.4*   Bilirubin Direct  --   --  1.2*   Albumin 3.4* 3.7 4.3   Alkaline Phosphatase 148* 179* 210*       Recent Labs   Lab 08/24/19  0836   Troponin I <0.01       Recent Labs   Lab 08/26/19  1313   PT INR 0.9   PT 12.3*       Microbiology Results     Procedure Component Value Units Date/Time    COVID-19 (SARS-COV-2) ([place] Standard test) [[id number]] Collected:  08/24/19 1732    Specimen:  Nasopharyngeal Swab from Nasopharynx Updated:  08/24/19 2119     Purpose of COVID testing Diagnostic -PUI     SARS-CoV-2 Specimen Source Nasopharyngeal    Narrative:       o Collect and clearly label specimen type:  o PREFERRED-Upper respiratory specimen: One Nasopharyngeal  Swab in Transport Media.  o Hand deliver to laboratory ASAP    Urine culture [[id number]] Collected:  08/24/19 0817    Specimen:  Bladder Urine Updated:  08/24/19 7829    Narrative:       Replace urinary catheter prior to obtaining the urine culture  if  it has been in place for greater than or equal to 14  days:->N/A No Foley  Indications for U/A Reflex to Micro - Reflex to  Culture:->Other (please specify in Comments)           Ct Abd/pelvis With Iv Contrast    Result Date: 08/24/2019   1. Acute pancreatitis with findings suspicious for pancreatic necrosis as described above. Clinical correlation with pancreatic enzyme levels is recommended. 2. Fatty infiltration of  the liver and additional findings as described above. Sandie Ano, MD  08/24/2019 10:20 AM    US Abdomen Limited Ruq (after Ct Abd)    Result Date: 08/24/2019   No sonographically identifiable cholelithiasis or biliary ductal dilatation. Laurena Slimmer, MD  08/24/2019 12:10 PM       Signed,    Dallie Piles         This chart was generated using a medical voice-recognition software which does not employ spell-checking or grammar-checking features. It was dictated, all or in part, in a busy and often noisy patient care environment. I have taken all usual measures to dictate carefully and to review all aspects of this chart. Nonetheless, given the known and well-documented performance characteristics of voice recognition software in such patient care environments, this dictation still may contain unrecognized and wholly unintended errors or omissions.

## 2019-08-26 NOTE — Plan of Care (Addendum)
Problem: Safety  Goal: Patient will be free from injury during hospitalization  Outcome: Progressing  Flowsheets (Taken 08/26/2019 0407)  Patient will be free from injury during hospitalization:   Provide and maintain safe environment   Hourly rounding   Provide alternative method of communication if needed (communication boards, writing)   Include patient/ family/ care giver in decisions related to safety   Assess for patients risk for elopement and implement Elopement Risk Plan per policy   Assess patient's risk for falls and implement fall prevention plan of care per policy  Goal: Patient will be free from infection during hospitalization  Outcome: Progressing  Flowsheets (Taken 08/24/2019 2002 by Laurel Dimmer, RN)  Free from Infection during hospitalization:   Assess and monitor for signs and symptoms of infection   Monitor lab/diagnostic results   Monitor all insertion sites (i.e. indwelling lines, tubes, urinary catheters, and drains)   Encourage patient and family to use good hand hygiene technique     Problem: Pain  Goal: Pain at adequate level as identified by patient  Outcome: Progressing  Flowsheets (Taken 08/24/2019 2002 by Laurel Dimmer, RN)  Pain at adequate level as identified by patient:   Identify patient comfort function goal   Assess pain on admission, during daily assessment and/or before any "as needed" intervention(s)   Reassess pain within 30-60 minutes of any procedure/intervention, per Pain Assessment, Intervention, Reassessment (AIR) Cycle   Evaluate if patient comfort function goal is met   Evaluate patient's satisfaction with pain management progress   Offer non-pharmacological pain management interventions     Problem: Side Effects from Pain Analgesia  Goal: Patient will experience minimal side effects of analgesic therapy  Outcome: Progressing  Flowsheets (Taken 08/24/2019 2112 by Holly Bodily, RN)  Patient will experience minimal side effects of analgesic therapy:    Monitor/assess patient's respiratory status (RR depth, effort, breath sounds)   Assess for changes in cognitive function   Prevent/manage side effects per LIP orders (i.e. nausea, vomiting, pruritus, constipation, urinary retention, etc.)   Evaluate for opioid-induced sedation with appropriate assessment tool (i.e. POSS)     Problem: Discharge Barriers  Goal: Patient will be discharged home or other facility with appropriate resources  Outcome: Progressing     Problem: Psychosocial and Spiritual Needs  Goal: Demonstrates ability to cope with hospitalization/illness  Outcome: Progressing  Flowsheets (Taken 08/26/2019 0407)  Demonstrates ability to cope with hospitalizations/illness:   Provide quiet environment   Include patient/ patient care companion in decisions   Encourage participation in diversional activity     Problem: Moderate/High Fall Risk Score >5  Goal: Patient will remain free of falls  Outcome: Progressing  Flowsheets (Taken 08/26/2019 0407)  Moderate Risk (6-13): MOD-Floor mat at bedside (where available) if appropriate     Problem: Addiction to alcohol or opioids or other substances AS EVIDENCED BY:  Goal: Will identify long and short term goals based on individual needs and strengths  Outcome: Progressing  Goal: Patient achieves a safe detoxification and management of withdrawal symptoms  Outcome: Progressing  Flowsheets (Taken 08/24/2019 2112 by Holly Bodily, RN)  Patient achieves a safe detoxification and management of withdrawal symptoms:   Assess withdrawal signs/symptoms according to identified protocol (e.g. CIWA/COWS)   Provide medication teaching including name, dosage, benefits, action, effect and side effects   Assess effectiveness (relief of withdrawal symptoms) and side effects of medication   Educate about health risks associated with withdrawal (e.g. seizures, DTs)   Ensure laboratory results are reviewed with the  LIP to increase understanding of the medical consequences of  addiction   Ensure adequate hydration and nutrition during withdrawal   Educate about the importance of reporting dizziness or unsteadiness while ambulating to care providers (e.g. RN, LIP)  Goal: Patient educated about the disease of addiction and recovery and identifies long-term goal  Outcome: Progressing  Flowsheets (Taken 08/26/2019 0407)  Patient educated about the disease of addiction and recovery and identifies long-term goal:   Encourage and monitor participation in all unit activities   Assist patient to identify coping strategies to use as alternatives to alcohol/substance abuse/dependency  Goal: Patient develops a discharge plan  Outcome: Progressing     Problem: Altered GI Function  Goal: Fluid and electrolyte balance are achieved/maintained  Outcome: Progressing  Flowsheets (Taken 08/26/2019 0407)  Fluid and electrolyte balance are achieved/maintained:   Monitor intake and output every shift   Monitor/assess lab values and report abnormal values   Assess for confusion/personality changes   Assess and reassess fluid and electrolyte status   Observe for seizure activity and initiate seizure precautions if indicated   Observe for cardiac arrhythmias   Monitor for muscle weakness   Monitor daily weight   Provide adequate hydration  Goal: Nutritional intake is adequate  Outcome: Progressing  Flowsheets (Taken 08/26/2019 0407)  Nutritional intake is adequate:   Monitor daily weights   Allow adequate time for meals   Assess anorexia, appetite, and amount of meal/food tolerated   Pt is A&Ox4. Her speech became more slurred as the night progressed and she received more medications. She claimed her ab pain was over 9/10 most of the night. She was given 1mg  of Dilaudid and heat packs and her pain only got as low as 7/10 and went back up to 9-10/10 within 2hrs. The on call doctor was called and was not comfortable increasing the Dilaudid. When the patient was told that, she accepted it. Phenergan given  for nausea, CIWA monitored and Ativan given as needed. Pt had tremors. She slept on and off. Urine was red and pt denied being on menses. She said she voided about 6 times and sometimes it is red and sometimes yellow. She said her mensus finished a while ago and should not be back yet, but that she has intermittent spotting. Urine sent for drug toxcity. NPO and ice chips given.

## 2019-08-26 NOTE — Plan of Care (Signed)
Problem: Safety  Goal: Patient will be free from injury during hospitalization  Outcome: Progressing  Flowsheets (Taken 08/26/2019 0407 by Tiffany Kocher, RN)  Patient will be free from injury during hospitalization:   Provide and maintain safe environment   Hourly rounding   Provide alternative method of communication if needed (communication boards, writing)   Include patient/ family/ care giver in decisions related to safety   Assess for patients risk for elopement and implement Elopement Risk Plan per policy   Assess patient's risk for falls and implement fall prevention plan of care per policy     Problem: Pain  Goal: Pain at adequate level as identified by patient  Outcome: Progressing  Flowsheets (Taken 08/24/2019 2002 by Laurel Dimmer, RN)  Pain at adequate level as identified by patient:   Identify patient comfort function goal   Assess pain on admission, during daily assessment and/or before any "as needed" intervention(s)   Reassess pain within 30-60 minutes of any procedure/intervention, per Pain Assessment, Intervention, Reassessment (AIR) Cycle   Evaluate if patient comfort function goal is met   Evaluate patient's satisfaction with pain management progress   Offer non-pharmacological pain management interventions     Problem: Addiction to alcohol or opioids or other substances AS EVIDENCED BY:  Goal: Patient achieves a safe detoxification and management of withdrawal symptoms  Outcome: Progressing  Flowsheets (Taken 08/24/2019 2112 by Holly Bodily, RN)  Patient achieves a safe detoxification and management of withdrawal symptoms:   Assess withdrawal signs/symptoms according to identified protocol (e.g. CIWA/COWS)   Provide medication teaching including name, dosage, benefits, action, effect and side effects   Assess effectiveness (relief of withdrawal symptoms) and side effects of medication   Educate about health risks associated with withdrawal (e.g. seizures, DTs)   Ensure laboratory results are  reviewed with the LIP to increase understanding of the medical consequences of addiction   Ensure adequate hydration and nutrition during withdrawal   Educate about the importance of reporting dizziness or unsteadiness while ambulating to care providers (e.g. RN, LIP)     Problem: Altered GI Function  Goal: Fluid and electrolyte balance are achieved/maintained  Outcome: Progressing  Flowsheets (Taken 08/26/2019 0407 by Tiffany Kocher, RN)  Fluid and electrolyte balance are achieved/maintained:   Monitor intake and output every shift   Monitor/assess lab values and report abnormal values   Assess for confusion/personality changes   Assess and reassess fluid and electrolyte status   Observe for seizure activity and initiate seizure precautions if indicated   Observe for cardiac arrhythmias   Monitor for muscle weakness   Monitor daily weight   Provide adequate hydration     Pt Aox4, VSS but tachy at times. On CIWA protocol, ativan was given as ordered. On seizure precaution. Reported abdominal pain, pain meds given as ordered.Replaced K and Mg. Remains NPO. Safety bundle on place.Maintained safety. Will follow plan of care.

## 2019-08-26 NOTE — Progress Notes (Signed)
Progress Note  [facility] [id number]    08/26/19      Assessment:  EtOH pancreatitis:  CT scan on admission 9/6 with acute pancreatitis with findings suspicious for pancreatic necrosis.  EtOH hepatitis   EtOH abuse/withdrawal    Active Hospital Problems    Diagnosis    Nausea vomiting and diarrhea    Hypokalemia    Hypophosphatemia    Hypomagnesemia    Thrombocytopenia    High anion gap metabolic acidosis    Marijuana abuse    Alcohol abuse    History of withdrawal seizures    Acute alcoholic hepatitis    Hyperbilirubinemia    Acute pancreatitis    Alcohol withdrawal syndrome without complication       Cassandra Gay is a 38 y.o. female with a hx of EtOH pancreatitis (7 prior episodes), EtOH abuse (had been sober x 8 months before she restarted last month), hx of EtOH withdrawal who came into the ED with nausea/vomiting, upper abdominal pain found to have EtOH pancreatitis with suspected necrosis and elevated LFTs.    Plan:  1. Continue NPO except for small amounts of clear liquid as tolerated.   2. Check PT/INR to calculate discriminant function.   3.  Discussed importance of EtOH cessation.  CIWA protocol.  Monitor for signs of EtOH withdrawal.   4. Continue to monitor daily LFTs.   5.  No need for Abx at this time, no evidence of infected necrosis.   6.  Continue supportive care, pain management/antiemetics prn, IVF. Correct electrolytes prn.  7.  Will d/w Dr. Andrey Campanile       This case will be discussed with Dr. Andrey Campanile.  Tedford Berg M Dayla Gasca, PA   10:01 AM    Subjective:  Patient still c/o epigastric/LUQ/RUQ pain radiating to the back, mild improvement today.  + tremors.  LFTs improving    Objective:    Current Facility-Administered Medications   Medication Dose Route Frequency Last Rate Last Dose    acetaminophen (TYLENOL) tablet 650 mg  650 mg Oral Q6H PRN        chlordiazePOXIDE (LIBRIUM) capsule 25 mg  25 mg Oral Q8H   25 mg at 08/26/19 0429    Followed by    Melene Muller ON 08/27/2019] chlordiazePOXIDE  (LIBRIUM) capsule 25 mg  25 mg Oral Q12H        Followed by    [START ON 08/29/2019] chlordiazePOXIDE (LIBRIUM) capsule 25 mg  25 mg Oral Q8H PRN        famotidine (PEPCID) tablet 20 mg  20 mg Oral Q12H SCH   20 mg at 08/26/19 0848    Or    famotidine (PEPCID) injection 20 mg  20 mg Intravenous Q12H SCH   20 mg at 08/25/19 2019    HYDROmorphone (DILAUDID) injection 1 mg  1 mg Intravenous Q4H PRN   1 mg at 08/26/19 0847    LORazepam (ATIVAN) injection 1 mg  1 mg Intravenous Once        LORazepam (ATIVAN) injection 1-2 mg  1-2 mg Intravenous Q1H PRN   2 mg at 08/26/19 0610    magnesium sulfate 1g in dextrose 5% IVPB (premix)  1 g Intravenous Q1H 100 mL/hr at 08/26/19 0957 1 g at 08/26/19 0957    naloxone (NARCAN) injection 0.2 mg  0.2 mg Intravenous PRN        ondansetron (ZOFRAN-ODT) disintegrating tablet 4 mg  4 mg Oral Q6H PRN        Or  ondansetron (ZOFRAN) injection 4 mg  4 mg Intravenous Q6H PRN   4 mg at 08/25/19 0713    potassium chloride (KLOR-CON) CR tablet 40 mEq  40 mEq Oral Q4H   40 mEq at 08/26/19 0847    promethazine (PHENERGAN) 12.5 mg in sodium chloride 0.9 % 50 mL IVPB  12.5 mg Intravenous Q6H PRN   12.5 mg at 08/26/19 0647    sodium chloride 0.9 % 1,000 mL with thiamine 100 mg, folic acid 1 mg, m.v.i. adult 10 mL infusion   Intravenous Daily 125 mL/hr at 08/26/19 0848         Physical Exam:  BP (!) 132/97    Pulse (!) 124    Temp 98.4 F (36.9 C) (Oral)    Resp 18    Ht 1.753 m (5\' 9" )    Wt 85.8 kg (189 lb 1.6 oz)    LMP 08/13/2019    SpO2 98%    BMI 27.93 kg/m     General Appearance: mildly anxious, AAOx3, +resting tremor  Lungs: CTA  Cardiac: Tachycardia  Abd: soft, epigastric/RUQ/LUQ tenderness with no rebound/gaurding, mild distension, BS+, no masses appreciated   Extremities: no edema present    Recent Labs   Lab 08/26/19  0622 08/25/19  0404 08/24/19  0836   WBC 6.15 5.53 5.56   Hgb 13.4 13.2 14.2   Hematocrit 38.6 39.4 42.0   Platelets 96* 89* 127*   MCV 95.3 97.0*  96.8*   Neutrophils  --  69.9 72.2     Recent Labs   Lab 08/26/19  0622 08/25/19  0404 08/24/19  0836   Sodium 133* 135* 138   Potassium 3.1* 3.0* 3.6   Chloride 97* 99* 96*   CO2 19* 15* 20*   BUN 3.0* 6.0* 11.0   Creatinine 0.7 0.7 0.9   Glucose 101* 126* 172*   Calcium 8.5 8.3* 9.5   Magnesium 1.4* 1.3*  --    Phosphorus 3.1 2.2*  --    Protein, Total 6.7 7.0 8.0   Albumin 3.4* 3.7 4.3   AST (SGOT) 72* 129* 232*   ALT 66* 94* 135*   Alkaline Phosphatase 148* 179* 210*   Bilirubin, Total 1.7* 1.7* 2.4*     Glucose:    Recent Labs   Lab 08/26/19  0622 08/25/19  0404 08/24/19  0836   Glucose 101* 126* 172*           Ct Abd/pelvis With Iv Contrast    Result Date: 08/24/2019   1. Acute pancreatitis with findings suspicious for pancreatic necrosis as described above. Clinical correlation with pancreatic enzyme levels is recommended. 2. Fatty infiltration of the liver and additional findings as described above. Sandie Ano, MD  08/24/2019 10:20 AM    US Abdomen Limited Ruq (after Ct Abd)    Result Date: 08/24/2019   No sonographically identifiable cholelithiasis or biliary ductal dilatation. Laurena Slimmer, MD  08/24/2019 12:10 PM

## 2019-08-26 NOTE — Progress Notes (Signed)
LOS # 1    Summary of Discharge Plan: home w/nn transport family      Identified Possible Discharge Barriers: none      CM Interventions and Outcome: CM spoke to Pt and she states she will be going back to [place] upon discharge.       Discussed above Discharge Plan with (patient, family, Care Team, others): care team and Pt      Case Management will continue to following on patient's discharge needs.      Laretta Bolster, LMSW  SW/Case Manager, [place]  [phone]

## 2019-08-26 NOTE — Consults (Signed)
Behavioral Health Liaison attempted to speak with patient, however, she is not available at this time will f/u.       Jinny Sanders, M.Div, [facility] Liaison  T: [phone]

## 2019-08-26 NOTE — Consults (Signed)
Behavioral Health Liaison consult received. Pt on CIWA protocol. Pt not available at this time. Will f/u.       Jinny Sanders, M.Div, [facility] Liaison  T: [phone]

## 2019-08-27 LAB — COMPREHENSIVE METABOLIC PANEL
ALT: 56 U/L — ABNORMAL HIGH (ref 0–55)
AST (SGOT): 69 U/L — ABNORMAL HIGH (ref 5–34)
Albumin/Globulin Ratio: 1 (ref 0.9–2.2)
Albumin: 3.1 g/dL — ABNORMAL LOW (ref 3.5–5.0)
Alkaline Phosphatase: 129 U/L — ABNORMAL HIGH (ref 37–106)
Anion Gap: 12 (ref 5.0–15.0)
BUN: 4 mg/dL — ABNORMAL LOW (ref 7.0–19.0)
Bilirubin, Total: 1.6 mg/dL — ABNORMAL HIGH (ref 0.2–1.2)
CO2: 21 mEq/L — ABNORMAL LOW (ref 22–29)
Calcium: 8.6 mg/dL (ref 8.5–10.5)
Chloride: 102 mEq/L (ref 100–111)
Creatinine: 0.6 mg/dL (ref 0.6–1.0)
Globulin: 3.1 g/dL (ref 2.0–3.6)
Glucose: 112 mg/dL — ABNORMAL HIGH (ref 70–100)
Potassium: 3.2 mEq/L — ABNORMAL LOW (ref 3.5–5.1)
Protein, Total: 6.2 g/dL (ref 6.0–8.3)
Sodium: 135 mEq/L — ABNORMAL LOW (ref 136–145)

## 2019-08-27 LAB — PHOSPHORUS: Phosphorus: 2.3 mg/dL (ref 2.3–4.7)

## 2019-08-27 LAB — GFR: EGFR: >60

## 2019-08-27 LAB — CBC
Absolute NRBC: 0 10*3/uL (ref 0.00–0.00)
Hematocrit: 33.9 % — ABNORMAL LOW (ref 34.7–43.7)
Hgb: 11.3 g/dL — ABNORMAL LOW (ref 11.4–14.8)
MCH: 32.4 pg (ref 25.1–33.5)
MCHC: 33.3 g/dL (ref 31.5–35.8)
MCV: 97.1 fL — ABNORMAL HIGH (ref 78.0–96.0)
MPV: 10.6 fL (ref 8.9–12.5)
Nucleated RBC: 0 /100 WBC (ref 0.0–0.0)
Platelets: 76 10*3/uL — ABNORMAL LOW (ref 142–346)
RBC: 3.49 10*6/uL — ABNORMAL LOW (ref 3.90–5.10)
RDW: 13 % (ref 11–15)
WBC: 3.61 10*3/uL (ref 3.10–9.50)

## 2019-08-27 LAB — MAGNESIUM: Magnesium: 1.5 mg/dL — ABNORMAL LOW (ref 1.6–2.6)

## 2019-08-27 LAB — HEMOLYSIS INDEX: Hemolysis Index: 7 (ref 0–18)

## 2019-08-27 NOTE — Plan of Care (Signed)
Problem: Safety  Goal: Patient will be free from injury during hospitalization  Flowsheets (Taken 08/27/2019 0455)  Patient will be free from injury during hospitalization:   Assess patient's risk for falls and implement fall prevention plan of care per policy   Provide and maintain safe environment   Use appropriate transfer methods   Hourly rounding   Include patient/ family/ care giver in decisions related to safety   Ensure appropriate safety devices are available at the bedside  Goal: Patient will be free from infection during hospitalization  Flowsheets (Taken 08/27/2019 0455)  Free from Infection during hospitalization:   Assess and monitor for signs and symptoms of infection   Monitor lab/diagnostic results   Encourage patient and family to use good hand hygiene technique     Problem: Pain  Goal: Pain at adequate level as identified by patient  Flowsheets (Taken 08/27/2019 0455)  Pain at adequate level as identified by patient:   Identify patient comfort function goal   Assess for risk of opioid induced respiratory depression, including snoring/sleep apnea. Alert healthcare team of risk factors identified.   Assess pain on admission, during daily assessment and/or before any "as needed" intervention(s)   Reassess pain within 30-60 minutes of any procedure/intervention, per Pain Assessment, Intervention, Reassessment (AIR) Cycle   Evaluate if patient comfort function goal is met   Evaluate patient's satisfaction with pain management progress   Offer non-pharmacological pain management interventions     Problem: Side Effects from Pain Analgesia  Goal: Patient will experience minimal side effects of analgesic therapy  Flowsheets (Taken 08/27/2019 0455)  Patient will experience minimal side effects of analgesic therapy:   Monitor/assess patient's respiratory status (RR depth, effort, breath sounds)   Assess for changes in cognitive function   Evaluate for opioid-induced sedation with appropriate assessment tool (i.e.  POSS)   Prevent/manage side effects per LIP orders (i.e. nausea, vomiting, pruritus, constipation, urinary retention, etc.)     Problem: Addiction to alcohol or opioids or other substances AS EVIDENCED BY:  Goal: Will identify long and short term goals based on individual needs and strengths  Flowsheets (Taken 08/27/2019 0455)  Pt will identify long and short term treatment goals based on individual needs and strengths:   Assist to identify goals for treatment based on individual needs and strengths   Assist patient to define criteria for discharge   Assist patient to sign acknowledgement of treatment plan participation  Goal: Patient achieves a safe detoxification and management of withdrawal symptoms  Flowsheets (Taken 08/27/2019 0455)  Patient achieves a safe detoxification and management of withdrawal symptoms:   Assess withdrawal signs/symptoms according to identified protocol (e.g. CIWA/COWS)   Provide medication teaching including name, dosage, benefits, action, effect and side effects   Assess effectiveness (relief of withdrawal symptoms) and side effects of medication   Educate about health risks associated with withdrawal (e.g. seizures, DTs)   Ensure adequate hydration and nutrition during withdrawal   Educate about the importance of reporting dizziness or unsteadiness while ambulating to care providers (e.g. RN, LIP)  Goal: Patient educated about the disease of addiction and recovery and identifies long-term goal  Flowsheets (Taken 08/26/2019 0407 by Tiffany Kocher, RN)  Patient educated about the disease of addiction and recovery and identifies long-term goal:   Encourage and monitor participation in all unit activities   Assist patient to identify coping strategies to use as alternatives to alcohol/substance abuse/dependency  Goal: Patient develops a discharge plan  Flowsheets (Taken 08/27/2019 0455)  Patient develops  a discharge plan:   Review healthy diversion activities that promote abstinence from  alcohol/substances   Review community resources and social supports that promote sober living   Review sober recovery transitional plan   Provide referral resources and assist in determining appropriate continuing care   Ensure follow up appointments for continued care are made

## 2019-08-27 NOTE — Progress Notes (Signed)
Patient requested to leave AMA, paged hospitalist and waiting for reply.

## 2019-08-27 NOTE — Progress Notes (Signed)
Pt Webb'd AMA this am at 0750 per nursing notes       08/27/19 0923   Discharge Disposition   Patient preference/choice provided? N/A   Physical Discharge Disposition AMA     Madie Reno, RN  Manager, Case Management  [phone]

## 2019-08-27 NOTE — Progress Notes (Signed)
Dr. Marland Mcalpine paged and will be here soon, but pt is refusing to wait. Pt is able to answer questions appropriately, though obviously upset and anxious. Pt is also able to ambulate by herself with some unsteadiness, and signed out AMA at 0750. MD notified.

## 2019-08-27 NOTE — Discharge Summary (Signed)
Patient: Cassandra Gay  Admission Date: 08/24/2019   DOB: [birth year]  Discharge (AMA) Date: 08/27/2019    MRN: [id number]  Attending:Zonie Crutcher Haywood Lasso DISCHARGE SUMMARY     Discharge Information   Diagnosis:   Active Hospital Problems    Diagnosis    Nausea vomiting and diarrhea    Hypokalemia    Hypophosphatemia    Hypomagnesemia    Thrombocytopenia    High anion gap metabolic acidosis    Marijuana abuse    Alcohol abuse    History of withdrawal seizures    Acute alcoholic hepatitis    Hyperbilirubinemia    Acute pancreatitis    Alcohol withdrawal syndrome without complication         Consultants: GI, behavioral health  Discharged to: patient left [facility]             Hospital Course   Presentation History   Per HPI   Amaira Safley is a 38 y.o. female with a PMHx of alcohol induced pancreatitis and alcoholism who presented with complaints of abdominal pain since yesterday morning as well as nausea and vomiting.  Patient stated that on Friday she developed nausea and vomiting.  Yesterday morning the patient developed epigastric and right upper quadrant pain, worsening, radiating to her back.  Patient also had 3 episodes of diarrhea yesterday.  She denies fever or chills.  She denies cough, chest pain or shortness of breath.  Patient stated that her symptoms are similar to the symptoms with prior episodes of acute pancreatitis.  Patient stated that last time she had pancreatitis was about 4 months ago.  Patient also stated that she relapsed into alcohol abuse about 1 month ago.  She stated that for the past month she has been drinking about half a gallon of vodka daily.  Her last drink was yesterday morning when she drank 1 shot of vodka.  Patient stated that she has a history of alcohol withdrawal and about a year ago she had a history of withdrawal induced seizures.  See HPI for details.      Hospital Course   Patient admitted for acute alcoholic pancreatitis. CT showed evidence of  pancreatic necrosis. GI consulted, recommended holding on abx. Placed on NPO with sips, IVFs, as needed pain meds and antiemetics, electrolyte replacement.  Placed on prn ativan per CIWA, scheduled librium. Patient decided to leave AMA.                 Procedures/Imaging:    Ct Abd/pelvis With Iv Contrast    Result Date: 08/24/2019   1. Acute pancreatitis with findings suspicious for pancreatic necrosis as described above. Clinical correlation with pancreatic enzyme levels is recommended. 2. Fatty infiltration of the liver and additional findings as described above. Sandie Ano, MD  08/24/2019 10:20 AM    US Abdomen Limited Ruq (after Ct Abd)    Result Date: 08/24/2019   No sonographically identifiable cholelithiasis or biliary ductal dilatation. Laurena Slimmer, MD  08/24/2019 12:10 PM          RECOMMENDATIONS:    - Patient was informed of abnormal and incidental imaging findings during hospitalization, and advised to review this information with their medical provider.              Diagnostics        Last Labs   Recent Labs   Lab 08/27/19  [phone] 08/26/19  5621 08/25/19  0404  WBC 3.61 6.15 5.53   RBC 3.49* 4.05 4.06   Hgb 11.3* 13.4 13.2   Hematocrit 33.9* 38.6 39.4   MCV 97.1* 95.3 97.0*   Platelets 76* 96* 89*       Recent Labs   Lab 08/27/19  0348 08/26/19  0622 08/25/19  0404 08/24/19  0836   Sodium 135* 133* 135* 138   Potassium 3.2* 3.1* 3.0* 3.6   Chloride 102 97* 99* 96*   CO2 21* 19* 15* 20*   BUN 4.0* 3.0* 6.0* 11.0   Creatinine 0.6 0.7 0.7 0.9   Glucose 112* 101* 126* 172*   Calcium 8.6 8.5 8.3* 9.5   Magnesium 1.5* 1.4* 1.3*  --        Microbiology Results     Procedure Component Value Units Date/Time    COVID-19 (SARS-COV-2) ([place] Standard test) [[id number]] Collected:  08/24/19 1732    Specimen:  Nasopharyngeal Swab from Nasopharynx Updated:  08/25/19 1800     Purpose of COVID testing Diagnostic -PUI     SARS-CoV-2 Specimen Source Nasopharyngeal     SARS CoV 2 Overall Result Not Detected     Comment: Test  performed using the Abbott m2000 SARS-CoV-2 EUA Test.  Please see Fact Sheets for patients and providers located at:  http://www.rice.biz/  This test is for the qualitative detection of SARS-CoV-2(COVID-19)  nucleic acid.  Viral nucleic acids may persist in vivo, independent of viability.  Detection of viral nucleic acid does not imply the presence of  infectious virus, or that virus nucleic acid is the cause of  clinical symptoms.  Test performance has not been established for  immunocompromised patients or patients without signs and symptoms of  respiratory infection. Negative results do not preclude SARS-CoV-2  infection and should not be used as the sole basis for patient  management decisions.  Invalid results may be due to inhibiting substances in the specimen  and recollection should occur.         Narrative:       o Collect and clearly label specimen type:  o PREFERRED-Upper respiratory specimen: One Nasopharyngeal  Swab in Transport Media.  o Hand deliver to laboratory ASAP    Urine culture [[id number]] Collected:  08/24/19 0817    Specimen:  Bladder Urine Updated:  08/26/19 0908    Narrative:       Replace urinary catheter prior to obtaining the urine culture  if it has been in place for greater than or equal to 14  days:->N/A No Foley  Indications for U/A Reflex to Micro - Reflex to  Culture:->Other (please specify in Comments)  ORDER#: [id number]                                    ORDERED BY: ATMAR, SAMIM  SOURCE: Urine                                        COLLECTED:  08/24/19 08:17  ANTIBIOTICS AT COLL.:                                RECEIVED :  08/24/19 08:27  Culture Urine  FINAL       08/26/19 09:08  08/26/19   10,000 - 30,000 CFU/ML of normal urogenital or skin microbiota, no             further work                  Medical illustrator,  Dallie Piles         This chart was generated using hospital voice-recognition software which does not employ spell-checking or  grammar-checking features. It was dictated, all or in part, in a busy and often noisy patient care environment. I have taken all usual measures to dictate carefully and to review all aspects this chart. Nonetheless, given the known and well-documented performance characteristics of VR software in such patient care environments, this dictation still may contain unrecognized and wholly unintended errors or omissions

## 2023-06-18 DEATH — deceased
